# Patient Record
Sex: Male | Born: 1970 | Race: White | Hispanic: No | Marital: Married | Smoking: Former smoker
Health system: Southern US, Community
[De-identification: ages and names within clinical notes are randomized; demographics above are authoritative.]

## PROBLEM LIST (undated history)

## (undated) DIAGNOSIS — I10 Essential (primary) hypertension: Secondary | ICD-10-CM

## (undated) DIAGNOSIS — E785 Hyperlipidemia, unspecified: Secondary | ICD-10-CM

## (undated) DIAGNOSIS — K219 Gastro-esophageal reflux disease without esophagitis: Secondary | ICD-10-CM

## (undated) DIAGNOSIS — T7840XA Allergy, unspecified, initial encounter: Secondary | ICD-10-CM

## (undated) HISTORY — DX: Hyperlipidemia, unspecified: E78.5

## (undated) HISTORY — PX: VASECTOMY: SHX75

## (undated) HISTORY — DX: Allergy, unspecified, initial encounter: T78.40XA

## (undated) HISTORY — DX: Essential (primary) hypertension: I10

## (undated) HISTORY — DX: Gastro-esophageal reflux disease without esophagitis: K21.9

## (undated) HISTORY — PX: WISDOM TOOTH EXTRACTION: SHX21

---

## 2019-07-30 ENCOUNTER — Encounter: Payer: Self-pay | Admitting: Gastroenterology

## 2019-08-18 ENCOUNTER — Encounter: Payer: Self-pay | Admitting: Gastroenterology

## 2019-08-18 ENCOUNTER — Ambulatory Visit (AMBULATORY_SURGERY_CENTER): Payer: Self-pay

## 2019-08-18 ENCOUNTER — Other Ambulatory Visit: Payer: Self-pay

## 2019-08-18 VITALS — Temp 96.6°F | Ht 68.0 in | Wt 164.0 lb

## 2019-08-18 DIAGNOSIS — Z1211 Encounter for screening for malignant neoplasm of colon: Secondary | ICD-10-CM

## 2019-08-18 MED ORDER — NA SULFATE-K SULFATE-MG SULF 17.5-3.13-1.6 GM/177ML PO SOLN: 1.0000 | Freq: Once | ORAL | 0 refills | Status: AC

## 2019-08-18 NOTE — Progress Notes (Signed)
Denies allergies to eggs or soy products. Denies complication of anesthesia or sedation. Denies use of weight loss medication. Denies use of O2.   Emmi instructions given for colonoscopy.  Covid screening scheduled for Tuesday 09/01/19 @ 3:20 Pm at [facility]. A 15.00 coupon for Suprep was given to the patient.

## 2019-09-01 ENCOUNTER — Other Ambulatory Visit (HOSPITAL_COMMUNITY)
Admission: RE | Admit: 2019-09-01 | Discharge: 2019-09-01 | Disposition: A | Discharge status: Home / Self Care | Payer: BC Managed Care – PPO | Source: Ambulatory Visit | Attending: Gastroenterology | Admitting: Gastroenterology

## 2019-09-01 ENCOUNTER — Other Ambulatory Visit: Payer: Self-pay

## 2019-09-01 DIAGNOSIS — Z20828 Contact with and (suspected) exposure to other viral communicable diseases: Secondary | ICD-10-CM | POA: Diagnosis not present

## 2019-09-01 DIAGNOSIS — Z01812 Encounter for preprocedural laboratory examination: Secondary | ICD-10-CM | POA: Diagnosis not present

## 2019-09-01 LAB — SARS CORONAVIRUS 2 (TAT 6-24 HRS): SARS Coronavirus 2: NEGATIVE

## 2019-09-04 ENCOUNTER — Ambulatory Visit (AMBULATORY_SURGERY_CENTER): Payer: BC Managed Care – PPO | Admitting: Gastroenterology

## 2019-09-04 ENCOUNTER — Encounter: Payer: Self-pay | Admitting: Gastroenterology

## 2019-09-04 ENCOUNTER — Other Ambulatory Visit: Payer: Self-pay

## 2019-09-04 VITALS — BP 123/85 | HR 89 | Temp 98.9°F | Resp 20 | Ht 68.0 in | Wt 164.0 lb

## 2019-09-04 DIAGNOSIS — Z1211 Encounter for screening for malignant neoplasm of colon: Secondary | ICD-10-CM | POA: Diagnosis present

## 2019-09-04 DIAGNOSIS — K635 Polyp of colon: Secondary | ICD-10-CM | POA: Diagnosis not present

## 2019-09-04 DIAGNOSIS — D124 Benign neoplasm of descending colon: Secondary | ICD-10-CM

## 2019-09-04 MED ORDER — SODIUM CHLORIDE 0.9 % IV SOLN: 500.0000 mL | Freq: Once | INTRAVENOUS | Status: DC

## 2019-09-04 NOTE — Patient Instructions (Signed)
Handouts given:  Polyps, Diverticulosis Resume Previous diet Continue present medications Await Pathology results     YOU HAD AN ENDOSCOPIC PROCEDURE TODAY AT [place]:   Refer to the procedure report that was given to you for any specific questions about what was found during the examination.  If the procedure report does not answer your questions, please call your gastroenterologist to clarify.  If you requested that your care partner not be given the details of your procedure findings, then the procedure report has been included in a sealed envelope for you to review at your convenience later.  YOU SHOULD EXPECT: Some feelings of bloating in the abdomen. Passage of more gas than usual.  Walking can help get rid of the air that was put into your GI tract during the procedure and reduce the bloating. If you had a lower endoscopy (such as a colonoscopy or flexible sigmoidoscopy) you may notice spotting of blood in your stool or on the toilet paper. If you underwent a bowel prep for your procedure, you may not have a normal bowel movement for a few days.  Please Note:  You might notice some irritation and congestion in your nose or some drainage.  This is from the oxygen used during your procedure.  There is no need for concern and it should clear up in a day or so.  SYMPTOMS TO REPORT IMMEDIATELY:   Following lower endoscopy (colonoscopy or flexible sigmoidoscopy):  Excessive amounts of blood in the stool  Significant tenderness or worsening of abdominal pains  Swelling of the abdomen that is new, acute  Fever of 100F or higher    For urgent or emergent issues, a gastroenterologist can be reached at any hour by calling [phone].   DIET:  We do recommend a small meal at first, but then you may proceed to your regular diet.  Drink plenty of fluids but you should avoid alcoholic beverages for 24 hours.  ACTIVITY:  You should plan to take it easy for the rest of  today and you should NOT DRIVE or use heavy machinery until tomorrow (because of the sedation medicines used during the test).    FOLLOW UP: Our staff will call the number listed on your records 48-72 hours following your procedure to check on you and address any questions or concerns that you may have regarding the information given to you following your procedure. If we do not reach you, we will leave a message.  We will attempt to reach you two times.  During this call, we will ask if you have developed any symptoms of COVID 19. If you develop any symptoms (ie: fever, flu-like symptoms, shortness of breath, cough etc.) before then, please call [phone].  If you test positive for Covid 19 in the 2 weeks post procedure, please call and report this information to [place].    If any biopsies were taken you will be contacted by phone or by letter within the next 1-3 weeks.  Please call us at [phone] if you have not heard about the biopsies in 3 weeks.    SIGNATURES/CONFIDENTIALITY: You and/or your care partner have signed paperwork which will be entered into your electronic medical record.  These signatures attest to the fact that that the information above on your After Visit Summary has been reviewed and is understood.  Full responsibility of the confidentiality of this discharge information lies with you and/or your care-partner.

## 2019-09-04 NOTE — Progress Notes (Signed)
PT taken to PACU. Monitors in place. VSS. Report given to RN. 

## 2019-09-04 NOTE — Progress Notes (Signed)
[facility] Temp-JB  History reviewed. 

## 2019-09-04 NOTE — Progress Notes (Signed)
Called to room to assist during endoscopic procedure.  Patient ID and intended procedure confirmed with present staff. Received instructions for my participation in the procedure from the performing physician.  

## 2019-09-04 NOTE — Op Note (Signed)
[facility] Patient Name: Aaron Roberts Procedure Date: 09/04/2019 2:03 PM MRN: [id number] Endoscopist: Milus Banister , MD Age: 48 Referring MD:  Date of Birth: 17-Jul-1971 Gender: Male Account #: [id number] Procedure:                Colonoscopy Indications:              Screening for colorectal malignant neoplasm Medicines:                Monitored Anesthesia Care Procedure:                Pre-Anesthesia Assessment:                           - Prior to the procedure, a History and Physical                            was performed, and patient medications and                            allergies were reviewed. The patient's tolerance of                            previous anesthesia was also reviewed. The risks                            and benefits of the procedure and the sedation                            options and risks were discussed with the patient.                            All questions were answered, and informed consent                            was obtained. Prior Anticoagulants: The patient has                            taken no previous anticoagulant or antiplatelet                            agents. ASA Grade Assessment: II - A patient with                            mild systemic disease. After reviewing the risks                            and benefits, the patient was deemed in                            satisfactory condition to undergo the procedure.                           After obtaining informed consent, the colonoscope  was passed under direct vision. Throughout the                            procedure, the patient's blood pressure, pulse, and                            oxygen saturations were monitored continuously. The                            Colonoscope was introduced through the anus and                            advanced to the the cecum, identified by                            appendiceal orifice and  ileocecal valve. The                            colonoscopy was performed without difficulty. The                            patient tolerated the procedure well. The quality                            of the bowel preparation was good. The ileocecal                            valve, appendiceal orifice, and rectum were                            photographed. Scope In: 2:11:29 PM Scope Out: 2:26:39 PM Scope Withdrawal Time: 0 hours 12 minutes 22 seconds  Total Procedure Duration: 0 hours 15 minutes 10 seconds  Findings:                 Two sessile polyps were found in the descending                            colon. The polyps were 2 to 4 mm in size. These                            polyps were removed with a cold snare. Resection                            and retrieval were complete.                           Multiple small-mouthed diverticula were found in                            the left colon.                           The exam was otherwise without abnormality on  direct and retroflexion views. Complications:            No immediate complications. Estimated blood loss:                            None. Estimated Blood Loss:     Estimated blood loss: none. Impression:               - Two 2 to 4 mm polyps in the descending colon,                            removed with a cold snare. Resected and retrieved.                           - Diverticulosis in the left colon.                           - The examination was otherwise normal on direct                            and retroflexion views. Recommendation:           - Patient has a contact number available for                            emergencies. The signs and symptoms of potential                            delayed complications were discussed with the                            patient. Return to normal activities tomorrow.                            Written discharge instructions were provided to  the                            patient.                           - Resume previous diet.                           - Continue present medications.                           - Await pathology results. Milus Banister, MD 09/04/2019 2:28:59 PM This report has been signed electronically.

## 2019-09-08 ENCOUNTER — Telehealth: Payer: Self-pay

## 2019-09-08 NOTE — Telephone Encounter (Signed)
  Follow up Call-  Call back number 09/04/2019  Post procedure Call Back phone  # [phone]  Permission to leave phone message Yes     Patient questions:  Do you have a fever, pain , or abdominal swelling? No. Pain Score  0 *  Have you tolerated food without any problems? Yes.    Have you been able to return to your normal activities? Yes.    Do you have any questions about your discharge instructions: Diet   No. Medications  No. Follow up visit  No.  Do you have questions or concerns about your Care? No.  Actions: * If pain score is 4 or above: No action needed, pain <4.  1. Have you developed a fever since your procedure? no  2.   Have you had an respiratory symptoms (SOB or cough) since your procedure? no  3.   Have you tested positive for COVID 19 since your procedure no  4.   Have you had any family members/close contacts diagnosed with the COVID 19 since your procedure?  no   If yes to any of these questions please route to Joylene John, RN and Alphonsa Gin, Therapist, sports.

## 2019-09-10 ENCOUNTER — Encounter: Payer: Self-pay | Admitting: Gastroenterology

## 2021-05-30 ENCOUNTER — Other Ambulatory Visit: Payer: Self-pay | Admitting: Urology

## 2021-05-30 DIAGNOSIS — C61 Malignant neoplasm of prostate: Secondary | ICD-10-CM

## 2021-06-15 ENCOUNTER — Other Ambulatory Visit: Payer: Self-pay | Admitting: Urology

## 2021-06-19 ENCOUNTER — Other Ambulatory Visit: Payer: Self-pay

## 2021-06-19 ENCOUNTER — Ambulatory Visit
Admission: RE | Admit: 2021-06-19 | Discharge: 2021-06-19 | Disposition: A | Discharge status: Home / Self Care | Payer: BC Managed Care – PPO | Source: Ambulatory Visit | Attending: Urology | Admitting: Urology

## 2021-06-19 DIAGNOSIS — C61 Malignant neoplasm of prostate: Secondary | ICD-10-CM

## 2021-06-19 MED ORDER — GADOBENATE DIMEGLUMINE 529 MG/ML IV SOLN
15.0000 mL | Freq: Once | INTRAVENOUS | Status: AC | PRN
  Administered 2021-06-19: 15 mL via INTRAVENOUS

## 2021-07-20 NOTE — Progress Notes (Signed)
DUE TO COVID-19 ONLY ONE VISITOR IS ALLOWED TO COME WITH YOU AND STAY IN THE WAITING ROOM ONLY DURING PRE OP AND PROCEDURE DAY OF SURGERY. THE 1 VISITOR  MAY VISIT WITH YOU AFTER SURGERY IN YOUR PRIVATE ROOM DURING VISITING HOURS ONLY!  YOU NEED TO HAVE A COVID 19 TEST ON_11/11/2020 ______ @_______ , THIS TEST MUST BE DONE BEFORE SURGERY,  COVID TESTING SITE IS AT [place]. PLEASE REMAIN IN YOUR CAR THIS IS A DRIVER UP TEST. AFTER YOUR COVID TEST PLEASE WEAR A MASK OUT IN PUBLIC AND SOCIAL DISTANCE AND [place] YOUR HANDS FREQUENTLY. PLEASE ASK ALL YOUR CLOSE CONTACTS TO WEAR A MASK OUT IN PUBLIC AND SOCIAL DISTANCE AND [place] HANDS FREQUENTLY ALSO.               Aaron Roberts  07/20/2021   Your procedure is scheduled on:  07/31/2021   Report to [facility] Main  Entrance   Report to admitting at   [place] AM     Call this number if you have problems the morning of surgery [phone]    Remember: Do not eat food , candy gum or mints :After Midnight. You may have clear liquids from midnight until __ 0415am   Clear liquid diet the day before surgery.  17 grams of Miralax in 4 ounces of water at 12 noon day before surgery. Fleets enema nite before surgery.   CLEAR LIQUID DIET   Foods Allowed                                                                       Coffee and tea, regular and decaf                              Plain Jell-O any favor except red or purple                                            Fruit ices (not with fruit pulp)                                      Iced Popsicles                                     Carbonated beverages, regular and diet                                    Cranberry, grape and apple juices Sports drinks like Gatorade Lightly seasoned clear broth or consume(fat free) Sugar   _____________________________________________________________________    BRUSH YOUR TEETH MORNING OF SURGERY AND RINSE YOUR MOUTH OUT, NO CHEWING  GUM CANDY OR MINTS.     Take these medicines the morning of surgery with A SIP OF WATER:  pepcid   DO NOT TAKE ANY DIABETIC MEDICATIONS DAY OF YOUR  SURGERY                               You may not have any metal on your body including hair pins and              piercings  Do not wear jewelry, make-up, lotions, powders or perfumes, deodorant             Do not wear nail polish on your fingernails.  Do not shave  48 hours prior to surgery.              Men may shave face and neck.   Do not bring valuables to the hospital. [place].  Contacts, dentures or bridgework may not be worn into surgery.  Leave suitcase in the car. After surgery it may be brought to your room.     Patients discharged the day of surgery will not be allowed to drive home. IF YOU ARE HAVING SURGERY AND GOING HOME THE SAME DAY, YOU MUST HAVE AN ADULT TO DRIVE YOU HOME AND BE WITH YOU FOR 24 HOURS. YOU MAY GO HOME BY TAXI OR UBER OR ORTHERWISE, BUT AN ADULT MUST ACCOMPANY YOU HOME AND STAY WITH YOU FOR 24 HOURS.  Name and phone number of your driver:  Special Instructions: N/A              Please read over the following fact sheets you were given: _____________________________________________________________________  [facility] - Preparing for Surgery Before surgery, you can play an important role.  Because skin is not sterile, your skin needs to be as free of germs as possible.  You can reduce the number of germs on your skin by washing with CHG (chlorahexidine gluconate) soap before surgery.  CHG is an antiseptic cleaner which kills germs and bonds with the skin to continue killing germs even after washing. Please DO NOT use if you have an allergy to CHG or antibacterial soaps.  If your skin becomes reddened/irritated stop using the CHG and inform your nurse when you arrive at Short Stay. Do not shave (including legs and underarms) for at least 48 hours prior to the  first CHG shower.  You may shave your face/neck. Please follow these instructions carefully:  1.  Shower with CHG Soap the night before surgery and the  morning of Surgery.  2.  If you choose to wash your hair, wash your hair first as usual with your  normal  shampoo.  3.  After you shampoo, rinse your hair and body thoroughly to remove the  shampoo.                           4.  Use CHG as you would any other liquid soap.  You can apply chg directly  to the skin and wash                       Gently with a scrungie or clean washcloth.  5.  Apply the CHG Soap to your body ONLY FROM THE NECK DOWN.   Do not use on face/ open                           Wound or  open sores. Avoid contact with eyes, ears mouth and genitals (private parts).                       Wash face,  Genitals (private parts) with your normal soap.             6.  Wash thoroughly, paying special attention to the area where your surgery  will be performed.  7.  Thoroughly rinse your body with warm water from the neck down.  8.  DO NOT shower/wash with your normal soap after using and rinsing off  the CHG Soap.                9.  Pat yourself dry with a clean towel.            10.  Wear clean pajamas.            11.  Place clean sheets on your bed the night of your first shower and do not  sleep with pets. Day of Surgery : Do not apply any lotions/deodorants the morning of surgery.  Please wear clean clothes to the hospital/surgery center.  FAILURE TO FOLLOW THESE INSTRUCTIONS MAY RESULT IN THE CANCELLATION OF YOUR SURGERY PATIENT SIGNATURE_________________________________  NURSE SIGNATURE__________________________________  ________________________________________________________________________

## 2021-07-20 NOTE — Progress Notes (Addendum)
Anesthesia Review:  PCP: Arletha Grippe- with [facility] in [place]  Cardiologist : none  Chest x-ray : EKG : 07/24/21  Echo : Stress test: Cardiac Cath :  Activity level: can do a flight of stairs without difficulty  Sleep Study/ CPAP : none  Fasting Blood Sugar :      / Checks Blood Sugar -- times a day:   Blood Thinner/ Instructions /Last Dose: ASA / Instructions/ Last Dose :   Covid test- to be done at [facility] Stay on 07/27/2021 at 1000am - due to pt lives in [place], [place].  Called and spoke with Chrys Racer in short stay.   Blood pressure at preop was 155/104 and 150/100.  PT denies any chest pain, shortness of breath, headache or dizziness.  PT states that at last few MD visits blood pressure has been elevated.  Pt to call MD in regards to this prior to surgery.

## 2021-07-24 ENCOUNTER — Other Ambulatory Visit: Payer: Self-pay

## 2021-07-24 ENCOUNTER — Encounter (HOSPITAL_COMMUNITY)
Admission: RE | Admit: 2021-07-24 | Discharge: 2021-07-24 | Disposition: A | Discharge status: Home / Self Care | Payer: BC Managed Care – PPO | Source: Ambulatory Visit | Attending: Urology | Admitting: Urology

## 2021-07-24 ENCOUNTER — Encounter (INDEPENDENT_AMBULATORY_CARE_PROVIDER_SITE_OTHER): Payer: Self-pay

## 2021-07-24 ENCOUNTER — Encounter (HOSPITAL_COMMUNITY): Payer: Self-pay

## 2021-07-24 DIAGNOSIS — Z01818 Encounter for other preprocedural examination: Secondary | ICD-10-CM | POA: Insufficient documentation

## 2021-07-24 LAB — CBC
HCT: 48.2 % (ref 39.0–52.0)
Hemoglobin: 15.8 g/dL (ref 13.0–17.0)
MCH: 30.1 pg (ref 26.0–34.0)
MCHC: 32.8 g/dL (ref 30.0–36.0)
MCV: 91.8 fL (ref 80.0–100.0)
Platelets: 301 10*3/uL (ref 150–400)
RBC: 5.25 MIL/uL (ref 4.22–5.81)
RDW: 12.8 % (ref 11.5–15.5)
WBC: 5.6 10*3/uL (ref 4.0–10.5)
nRBC: 0 % (ref 0.0–0.2)

## 2021-07-24 LAB — BASIC METABOLIC PANEL
Anion gap: 6 (ref 5–15)
BUN: 11 mg/dL (ref 6–20)
CO2: 27 mmol/L (ref 22–32)
Calcium: 9.4 mg/dL (ref 8.9–10.3)
Chloride: 105 mmol/L (ref 98–111)
Creatinine, Ser: 1.01 mg/dL (ref 0.61–1.24)
GFR, Estimated: >60 mL/min (ref >60)
Glucose, Bld: 106 mg/dL — ABNORMAL HIGH (ref 70–99)
Potassium: 4.7 mmol/L (ref 3.5–5.1)
Sodium: 138 mmol/L (ref 135–145)

## 2021-07-27 ENCOUNTER — Other Ambulatory Visit (HOSPITAL_COMMUNITY)
Admission: RE | Admit: 2021-07-27 | Discharge: 2021-07-27 | Disposition: A | Discharge status: Home / Self Care | Payer: BC Managed Care – PPO | Source: Ambulatory Visit | Attending: Urology | Admitting: Urology

## 2021-07-27 DIAGNOSIS — Z01812 Encounter for preprocedural laboratory examination: Secondary | ICD-10-CM | POA: Diagnosis present

## 2021-07-27 DIAGNOSIS — Z20822 Contact with and (suspected) exposure to covid-19: Secondary | ICD-10-CM | POA: Insufficient documentation

## 2021-07-27 DIAGNOSIS — Z01818 Encounter for other preprocedural examination: Secondary | ICD-10-CM

## 2021-07-28 LAB — SARS CORONAVIRUS 2 (TAT 6-24 HRS): SARS Coronavirus 2: NEGATIVE

## 2021-07-28 NOTE — H&P (Signed)
Office Visit Report     07/18/2021   --------------------------------------------------------------------------------   Aaron Roberts  MRN: [id number]  DOB: Jul 03, 1971, 50 year old Male  SSN:    PRIMARY CARE:    REFERRING:  Aaron Miles, MD  PROVIDER:  Raynelle Bring, M.D.  TREATING:  Aaron Roberts [place], [place]  LOCATION:  [facility] [phone]     --------------------------------------------------------------------------------   CC/HPI: Pt presents today for pre-operative history and physical exam in anticipation of robotic assisted lap radical prostatectomy with bilateral pelvic lymph node dissection by Dr. Alinda Money on 07/31/21. He is doing well and is without complaint.   Pt denies F/C, HA, CP, SOB, N/V, diarrhea/constipation, back pain, flank pain, hematuria, and dysuria.     HX:   CC: Prostate Cancer   Physician requesting consult: Dr. Will Bonnet  PCP:   Aaron Roberts is a 50 year old gentleman who was noted to have an elevated PSA of 6.31 and a left prostate nodule. This prompted a TRUS biopsy of the prostate by Dr. Will Bonnet on 03/30/21 that confirmed Gleason 3+4=7 adenocarcinoma of the prostate with 6 out of 12 biopsy cores positive for malignancy.   Family history: No prostate cancer.   Imaging studies: None.   PMH: He has a history of GERD, hypertension, and hyperlipidemia.  PSH: No abdominal surgeries.   TNM stage: cT2b Nx Mx (Bilateral apical induration, Left > right)  PSA: 6.31  Gleason score: 3+4=7 (GG 2)  Biopsy (03/30/21 - ready by Dr. Nelva Nay, Acc# [id number]): 6/12 cores positive  Left: L apex (2/2 cores, 20%, 3+4=7), L mid (1/2 cores, 10%, 3+3=6)  Right: R mid (1/2 cores, 10%, 3+3=6), R base (2/2 cores, 40%, 3+4=7)  Prostate volume: 14.7 cc   Nomogram  OC disease: 58%  EPE: 40%  SVI: 4%  LNI: 6%  PFS (5 year, 10 year): 76%, 62%   Urinary function: IPSS is 1.  Erectile function: SHIM score is 22.     ALLERGIES: None    MEDICATIONS: Lisinopril 10 mg tablet  Famotidine 20 mg tablet  Rosuvastatin Calcium 5 mg tablet     GU PSH: None   NON-GU PSH: None   GU PMH: Prostate Cancer - 06/19/2021, - 05/30/2021    NON-GU PMH: Muscle weakness (generalized) - 06/19/2021 Other muscle spasm - 06/19/2021 Other specified disorders of muscle - 06/19/2021 Anxiety GERD Hypercholesterolemia Hypertension    FAMILY HISTORY: Breast Cancer - Sister Kidney Cancer - Father Lung Cancer - Brother narcolepsy - Sister   SOCIAL HISTORY: Marital Status: Married Preferred Language: English; Ethnicity: Not Hispanic Or Latino; Race: White Current Smoking Status: Patient does not smoke anymore. Has not smoked since 05/25/2000.   Tobacco Use Assessment Completed: Used Tobacco in last 30 days? Uses smokeless tobacco. Does drink.  Does not use drugs. Drinks 2 caffeinated drinks per day. Has not had a blood transfusion.     Notes: currently dips two or three times per day  ETOH beer 2-3 per day    REVIEW OF SYSTEMS:    GU Review Male:   Patient denies frequent urination, hard to postpone urination, burning/ pain with urination, get up at night to urinate, leakage of urine, stream starts and stops, trouble starting your stream, have to strain to urinate , erection problems, and penile pain.  Gastrointestinal (Upper):   Patient denies nausea, vomiting, and indigestion/ heartburn.  Gastrointestinal (Lower):   Patient denies diarrhea and constipation.  Constitutional:   Patient denies fever, night sweats, weight  loss, and fatigue.  Skin:   Patient denies skin rash/ lesion and itching.  Eyes:   Patient denies blurred vision and double vision.  Ears/ Nose/ Throat:   Patient denies sore throat and sinus problems.  Hematologic/Lymphatic:   Patient denies swollen glands and easy bruising.  Cardiovascular:   Patient denies leg swelling and chest pains.  Respiratory:   Patient denies cough and shortness of breath.  Endocrine:   Patient  denies excessive thirst.  Musculoskeletal:   Patient denies back pain and joint pain.  Neurological:   Patient denies headaches and dizziness.  Psychologic:   Patient denies depression and anxiety.   VITAL SIGNS:      07/18/2021 02:16 PM  Weight 165 lb / 74.84 kg  Height 68 in / 172.72 cm  BP 152/102 mmHg  Pulse 99 /min  Temperature 98.2 F / 36.7 C  BMI 25.1 kg/m   MULTI-SYSTEM PHYSICAL EXAMINATION:    Constitutional: Well-nourished. No physical deformities. Normally developed. Good grooming.  Neck: Neck symmetrical, not swollen. Normal tracheal position.  Respiratory: Normal breath sounds. No labored breathing, no use of accessory muscles.   Cardiovascular: Regular rate and rhythm. No murmur, no gallop.  Lymphatic: No enlargement of neck, axillae, groin.  Skin: No paleness, no jaundice, no cyanosis. No lesion, no ulcer, no rash.  Neurologic / Psychiatric: Oriented to time, oriented to place, oriented to person. No depression, no anxiety, no agitation.  Gastrointestinal: No mass, no tenderness, no rigidity, non obese abdomen.  Eyes: Normal conjunctivae. Normal eyelids.  Ears, Nose, Mouth, and Throat: Left ear no scars, no lesions, no masses. Right ear no scars, no lesions, no masses. Nose no scars, no lesions, no masses. Normal hearing. Normal lips.  Musculoskeletal: Normal gait and station of head and neck.     Complexity of Data:  Records Review:   Previous Patient Records  Urine Test Review:   Urinalysis   07/18/21  Urinalysis  Urine Appearance Clear   Urine Color Yellow   Urine Glucose Neg mg/dL  Urine Bilirubin Neg mg/dL  Urine Ketones Neg mg/dL  Urine Specific Gravity 1.015   Urine Blood Neg ery/uL  Urine pH 6.5   Urine Protein Neg mg/dL  Urine Urobilinogen 0.2 mg/dL  Urine Nitrites Neg   Urine Leukocyte Esterase Neg leu/uL   PROCEDURES:          Urinalysis - 81003 Dipstick Dipstick Cont'd  Color: Yellow Bilirubin: Neg mg/dL  Appearance: Clear Ketones: Neg  mg/dL  Specific Gravity: 1.015 Blood: Neg ery/uL  pH: 6.5 Protein: Neg mg/dL  Glucose: Neg mg/dL Urobilinogen: 0.2 mg/dL    Nitrites: Neg    Leukocyte Esterase: Neg leu/uL    ASSESSMENT:      ICD-10 Details  1 GU:   Prostate Cancer - C61    PLAN:           Schedule Return Visit/Planned Activity: Keep Scheduled Appointment - Schedule Surgery          Document Letter(s):  Created for Patient: Clinical Summary         Notes:   There are no changes in the patients history or physical exam since last evaluation by Dr. Alinda Money. Pt is scheduled to undergo RALP with BPLND on 07/31/21.   His BP is elevated today. He states he has not taken his meds yet today and will keep a close check on it at home.   All pt's questions were answered to the best of my ability.  Next Appointment:      Next Appointment: 07/31/2021 07:15 AM    Appointment Type: Surgery     Location: [facility] [phone]    Provider: Raynelle Bring, M.D.    Reason for Visit: WL/EXT REC RA LAP RAD PROSTATECTOMY LEVEL 2, BPLA WITH AMANDA      * Signed by Mcarthur Rossetti, PA on 07/18/21 at 3:42 PM (EDT*

## 2021-07-29 NOTE — Anesthesia Preprocedure Evaluation (Addendum)
Anesthesia Evaluation  Patient identified by MRN, date of birth, ID band Patient awake    Reviewed: Allergy & Precautions, H&P , NPO status , Patient's Chart, lab work & pertinent test results  Airway Mallampati: II  TM Distance: >3 FB Neck ROM: Full    Dental no notable dental hx. (+) Teeth Intact, Dental Advisory Given   Pulmonary neg pulmonary ROS, former smoker,    Pulmonary exam normal breath sounds clear to auscultation       Cardiovascular Exercise Tolerance: Good hypertension, Pt. on medications  Rhythm:Regular Rate:Normal     Neuro/Psych negative neurological ROS  negative psych ROS   GI/Hepatic Neg liver ROS, GERD  Medicated,  Endo/Other  negative endocrine ROS  Renal/GU negative Renal ROS  negative genitourinary   Musculoskeletal   Abdominal   Peds  Hematology negative hematology ROS (+)   Anesthesia Other Findings   Reproductive/Obstetrics negative OB ROS                            Anesthesia Physical Anesthesia Plan  ASA: 2  Anesthesia Plan: General   Post-op Pain Management:    Induction: Intravenous  PONV Risk Score and Plan: 3 and Ondansetron, Dexamethasone and Midazolam  Airway Management Planned: Oral ETT  Additional Equipment:   Intra-op Plan:   Post-operative Plan: Extubation in OR  Informed Consent: I have reviewed the patients History and Physical, chart, labs and discussed the procedure including the risks, benefits and alternatives for the proposed anesthesia with the patient or authorized representative who has indicated his/her understanding and acceptance.     Dental advisory given  Plan Discussed with: CRNA  Anesthesia Plan Comments:        Anesthesia Quick Evaluation

## 2021-07-31 ENCOUNTER — Encounter (HOSPITAL_COMMUNITY): Payer: Self-pay | Admitting: Urology

## 2021-07-31 ENCOUNTER — Ambulatory Visit (HOSPITAL_COMMUNITY): Payer: BC Managed Care – PPO | Admitting: Certified Registered Nurse Anesthetist

## 2021-07-31 ENCOUNTER — Observation Stay (HOSPITAL_COMMUNITY)
Admission: RE | Admit: 2021-07-31 | Discharge: 2021-08-01 | Disposition: A | Discharge status: Home / Self Care | Payer: BC Managed Care – PPO | Source: Ambulatory Visit | Attending: Urology | Admitting: Urology

## 2021-07-31 ENCOUNTER — Encounter (HOSPITAL_COMMUNITY)
Admission: RE | Disposition: A | Discharge status: Home / Self Care | Payer: Self-pay | Source: Ambulatory Visit | Attending: Urology

## 2021-07-31 ENCOUNTER — Ambulatory Visit (HOSPITAL_COMMUNITY): Payer: BC Managed Care – PPO | Admitting: Physician Assistant

## 2021-07-31 DIAGNOSIS — C61 Malignant neoplasm of prostate: Principal | ICD-10-CM | POA: Diagnosis present

## 2021-07-31 DIAGNOSIS — Z79899 Other long term (current) drug therapy: Secondary | ICD-10-CM | POA: Diagnosis not present

## 2021-07-31 DIAGNOSIS — I10 Essential (primary) hypertension: Secondary | ICD-10-CM | POA: Diagnosis not present

## 2021-07-31 HISTORY — PX: ROBOT ASSISTED LAPAROSCOPIC RADICAL PROSTATECTOMY: SHX5141

## 2021-07-31 LAB — HEMOGLOBIN AND HEMATOCRIT, BLOOD
HCT: 44.3 % (ref 39.0–52.0)
Hemoglobin: 14.5 g/dL (ref 13.0–17.0)

## 2021-07-31 LAB — TYPE AND SCREEN
ABO/RH(D): O POS
Antibody Screen: NEGATIVE

## 2021-07-31 LAB — ABO/RH: ABO/RH(D): O POS

## 2021-07-31 SURGERY — XI ROBOTIC ASSISTED LAPAROSCOPIC RADICAL PROSTATECTOMY LEVEL 2
Anesthesia: General

## 2021-07-31 MED ORDER — ONDANSETRON HCL 4 MG/2ML IJ SOLN
INTRAMUSCULAR | Status: AC
  Filled 2021-07-31: qty 2

## 2021-07-31 MED ORDER — KETAMINE HCL 10 MG/ML IJ SOLN
INTRAMUSCULAR | Status: DC | PRN
  Administered 2021-07-31: 30 mg via INTRAVENOUS

## 2021-07-31 MED ORDER — ROCURONIUM BROMIDE 10 MG/ML (PF) SYRINGE
PREFILLED_SYRINGE | INTRAVENOUS | Status: AC
  Filled 2021-07-31: qty 10

## 2021-07-31 MED ORDER — BELLADONNA ALKALOIDS-OPIUM 16.2-60 MG RE SUPP
1.0000 | Freq: Four times a day (QID) | RECTAL | Status: DC | PRN
  Filled 2021-07-31: qty 1

## 2021-07-31 MED ORDER — ROSUVASTATIN CALCIUM 5 MG PO TABS
5.0000 mg | ORAL_TABLET | Freq: Every day | ORAL | Status: DC
  Administered 2021-07-31 – 2021-08-01 (×2): 5 mg via ORAL
  Filled 2021-07-31 (×2): qty 1

## 2021-07-31 MED ORDER — ACETAMINOPHEN 500 MG PO TABS
1000.0000 mg | ORAL_TABLET | Freq: Once | ORAL | Status: AC
  Administered 2021-07-31: 1000 mg via ORAL
  Filled 2021-07-31: qty 2

## 2021-07-31 MED ORDER — BACITRACIN-NEOMYCIN-POLYMYXIN 400-5-5000 EX OINT: 1.0000 "application " | TOPICAL_OINTMENT | Freq: Three times a day (TID) | CUTANEOUS | Status: DC | PRN

## 2021-07-31 MED ORDER — STERILE WATER FOR IRRIGATION IR SOLN
Status: DC | PRN
  Administered 2021-07-31: 1000 mL

## 2021-07-31 MED ORDER — MIDAZOLAM HCL 2 MG/2ML IJ SOLN
INTRAMUSCULAR | Status: AC
  Filled 2021-07-31: qty 2

## 2021-07-31 MED ORDER — FLEET ENEMA 7-19 GM/118ML RE ENEM: 1.0000 | ENEMA | Freq: Once | RECTAL | Status: DC

## 2021-07-31 MED ORDER — BUPIVACAINE-EPINEPHRINE (PF) 0.25% -1:200000 IJ SOLN
INTRAMUSCULAR | Status: AC
  Filled 2021-07-31: qty 30

## 2021-07-31 MED ORDER — KETOROLAC TROMETHAMINE 15 MG/ML IJ SOLN
15.0000 mg | Freq: Four times a day (QID) | INTRAMUSCULAR | Status: DC
  Administered 2021-07-31 – 2021-08-01 (×3): 15 mg via INTRAVENOUS
  Filled 2021-07-31 (×3): qty 1

## 2021-07-31 MED ORDER — KCL IN DEXTROSE-NACL 20-5-0.45 MEQ/L-%-% IV SOLN
INTRAVENOUS | Status: AC
  Administered 2021-07-31: 1000 mL via INTRAVENOUS
  Filled 2021-07-31: qty 1000

## 2021-07-31 MED ORDER — HYDROMORPHONE HCL 1 MG/ML IJ SOLN
INTRAMUSCULAR | Status: DC | PRN
  Administered 2021-07-31: 1 mg via INTRAVENOUS

## 2021-07-31 MED ORDER — PHENYLEPHRINE 40 MCG/ML (10ML) SYRINGE FOR IV PUSH (FOR BLOOD PRESSURE SUPPORT)
PREFILLED_SYRINGE | INTRAVENOUS | Status: AC
  Filled 2021-07-31: qty 10

## 2021-07-31 MED ORDER — PROPOFOL 500 MG/50ML IV EMUL
INTRAVENOUS | Status: DC | PRN
  Administered 2021-07-31: 25 ug/kg/min via INTRAVENOUS

## 2021-07-31 MED ORDER — DIPHENHYDRAMINE HCL 12.5 MG/5ML PO ELIX: 12.5000 mg | ORAL_SOLUTION | Freq: Four times a day (QID) | ORAL | Status: DC | PRN

## 2021-07-31 MED ORDER — POLYETHYLENE GLYCOL 3350 17 G PO PACK: 17.0000 g | PACK | Freq: Every day | ORAL | Status: DC

## 2021-07-31 MED ORDER — SULFAMETHOXAZOLE-TRIMETHOPRIM 800-160 MG PO TABS: 1.0000 | ORAL_TABLET | Freq: Two times a day (BID) | ORAL | 0 refills | Status: DC

## 2021-07-31 MED ORDER — DOCUSATE SODIUM 100 MG PO CAPS: 100.0000 mg | ORAL_CAPSULE | Freq: Two times a day (BID) | ORAL | Status: AC

## 2021-07-31 MED ORDER — PROPOFOL 10 MG/ML IV BOLUS
INTRAVENOUS | Status: DC | PRN
  Administered 2021-07-31: 50 mg via INTRAVENOUS
  Administered 2021-07-31: 150 mg via INTRAVENOUS

## 2021-07-31 MED ORDER — CEFAZOLIN SODIUM-DEXTROSE 1-4 GM/50ML-% IV SOLN
1.0000 g | Freq: Three times a day (TID) | INTRAVENOUS | Status: AC
  Administered 2021-07-31 (×2): 1 g via INTRAVENOUS
  Filled 2021-07-31: qty 50

## 2021-07-31 MED ORDER — PROPOFOL 1000 MG/100ML IV EMUL
INTRAVENOUS | Status: AC
  Filled 2021-07-31: qty 100

## 2021-07-31 MED ORDER — ZOLPIDEM TARTRATE 5 MG PO TABS: 5.0000 mg | ORAL_TABLET | Freq: Every evening | ORAL | Status: DC | PRN

## 2021-07-31 MED ORDER — HEPARIN SODIUM (PORCINE) 1000 UNIT/ML IJ SOLN
INTRAMUSCULAR | Status: AC
  Filled 2021-07-31: qty 1

## 2021-07-31 MED ORDER — LIDOCAINE HCL (PF) 2 % IJ SOLN
INTRAMUSCULAR | Status: DC | PRN
  Administered 2021-07-31: 1.5 mg/kg/h via INTRADERMAL

## 2021-07-31 MED ORDER — KETAMINE HCL 10 MG/ML IJ SOLN
INTRAMUSCULAR | Status: AC
  Filled 2021-07-31: qty 1

## 2021-07-31 MED ORDER — PHENYLEPHRINE HCL (PRESSORS) 10 MG/ML IV SOLN
INTRAVENOUS | Status: AC
  Filled 2021-07-31: qty 2

## 2021-07-31 MED ORDER — SODIUM CHLORIDE 0.9 % IR SOLN
Status: DC | PRN
  Administered 2021-07-31: 1000 mL via INTRAVESICAL

## 2021-07-31 MED ORDER — MIDAZOLAM HCL 5 MG/5ML IJ SOLN
INTRAMUSCULAR | Status: DC | PRN
  Administered 2021-07-31: 2 mg via INTRAVENOUS

## 2021-07-31 MED ORDER — LISINOPRIL 10 MG PO TABS
10.0000 mg | ORAL_TABLET | Freq: Every day | ORAL | Status: DC
  Administered 2021-07-31 – 2021-08-01 (×2): 10 mg via ORAL
  Filled 2021-07-31 (×2): qty 1

## 2021-07-31 MED ORDER — HYDROMORPHONE HCL 1 MG/ML IJ SOLN: 0.2500 mg | INTRAMUSCULAR | Status: DC | PRN

## 2021-07-31 MED ORDER — ROCURONIUM BROMIDE 10 MG/ML (PF) SYRINGE
PREFILLED_SYRINGE | INTRAVENOUS | Status: DC | PRN
  Administered 2021-07-31: 30 mg via INTRAVENOUS
  Administered 2021-07-31: 70 mg via INTRAVENOUS
  Administered 2021-07-31: 20 mg via INTRAVENOUS

## 2021-07-31 MED ORDER — PHENYLEPHRINE 40 MCG/ML (10ML) SYRINGE FOR IV PUSH (FOR BLOOD PRESSURE SUPPORT)
PREFILLED_SYRINGE | INTRAVENOUS | Status: DC | PRN
  Administered 2021-07-31 (×2): 120 ug via INTRAVENOUS
  Administered 2021-07-31: 80 ug via INTRAVENOUS
  Administered 2021-07-31: 120 ug via INTRAVENOUS
  Administered 2021-07-31: 80 ug via INTRAVENOUS

## 2021-07-31 MED ORDER — HYDROMORPHONE HCL 2 MG/ML IJ SOLN
INTRAMUSCULAR | Status: AC
  Filled 2021-07-31: qty 1

## 2021-07-31 MED ORDER — LIDOCAINE HCL 2 % IJ SOLN
INTRAMUSCULAR | Status: AC
  Filled 2021-07-31: qty 20

## 2021-07-31 MED ORDER — KETOROLAC TROMETHAMINE 15 MG/ML IJ SOLN
INTRAMUSCULAR | Status: AC
  Administered 2021-07-31: 15 mg via INTRAVENOUS
  Filled 2021-07-31: qty 1

## 2021-07-31 MED ORDER — SUGAMMADEX SODIUM 200 MG/2ML IV SOLN
INTRAVENOUS | Status: DC | PRN
Start: 2021-07-31 — End: 2021-07-31
  Administered 2021-07-31: 200 mg via INTRAVENOUS

## 2021-07-31 MED ORDER — DEXAMETHASONE SODIUM PHOSPHATE 10 MG/ML IJ SOLN
INTRAMUSCULAR | Status: AC
  Filled 2021-07-31: qty 1

## 2021-07-31 MED ORDER — ORAL CARE MOUTH RINSE: 15.0000 mL | Freq: Once | OROMUCOSAL | Status: AC

## 2021-07-31 MED ORDER — BUPIVACAINE-EPINEPHRINE (PF) 0.25% -1:200000 IJ SOLN
INTRAMUSCULAR | Status: DC | PRN
  Administered 2021-07-31: 30 mL

## 2021-07-31 MED ORDER — TRAMADOL HCL 50 MG PO TABS: 50.0000 mg | ORAL_TABLET | Freq: Four times a day (QID) | ORAL | 0 refills | Status: DC | PRN

## 2021-07-31 MED ORDER — CEFAZOLIN SODIUM-DEXTROSE 2-4 GM/100ML-% IV SOLN
2.0000 g | Freq: Once | INTRAVENOUS | Status: AC
  Administered 2021-07-31: 2 g via INTRAVENOUS
  Filled 2021-07-31: qty 100

## 2021-07-31 MED ORDER — PHENYLEPHRINE HCL-NACL 20-0.9 MG/250ML-% IV SOLN
INTRAVENOUS | Status: DC | PRN
  Administered 2021-07-31: 50 ug/min via INTRAVENOUS

## 2021-07-31 MED ORDER — BUPIVACAINE-EPINEPHRINE (PF) 0.5% -1:200000 IJ SOLN
INTRAMUSCULAR | Status: AC
  Filled 2021-07-31: qty 30

## 2021-07-31 MED ORDER — SODIUM CHLORIDE 0.9 % IV BOLUS
1000.0000 mL | Freq: Once | INTRAVENOUS | Status: AC
  Administered 2021-07-31: 1000 mL via INTRAVENOUS

## 2021-07-31 MED ORDER — DIPHENHYDRAMINE HCL 50 MG/ML IJ SOLN: 12.5000 mg | Freq: Four times a day (QID) | INTRAMUSCULAR | Status: DC | PRN

## 2021-07-31 MED ORDER — KCL IN DEXTROSE-NACL 20-5-0.45 MEQ/L-%-% IV SOLN
INTRAVENOUS | Status: DC
  Filled 2021-07-31 (×2): qty 1000

## 2021-07-31 MED ORDER — LACTATED RINGERS IV SOLN
INTRAVENOUS | Status: DC | PRN
  Administered 2021-07-31: 1000 mL

## 2021-07-31 MED ORDER — ONDANSETRON HCL 4 MG/2ML IJ SOLN: 4.0000 mg | INTRAMUSCULAR | Status: DC | PRN

## 2021-07-31 MED ORDER — CEFAZOLIN SODIUM-DEXTROSE 1-4 GM/50ML-% IV SOLN
INTRAVENOUS | Status: AC
  Filled 2021-07-31: qty 50

## 2021-07-31 MED ORDER — FENTANYL CITRATE (PF) 100 MCG/2ML IJ SOLN
INTRAMUSCULAR | Status: AC
  Filled 2021-07-31: qty 2

## 2021-07-31 MED ORDER — LACTATED RINGERS IV SOLN: INTRAVENOUS | Status: DC

## 2021-07-31 MED ORDER — CHLORHEXIDINE GLUCONATE CLOTH 2 % EX PADS: 6.0000 | MEDICATED_PAD | Freq: Every day | CUTANEOUS | Status: DC

## 2021-07-31 MED ORDER — FENTANYL CITRATE (PF) 250 MCG/5ML IJ SOLN
INTRAMUSCULAR | Status: AC
  Filled 2021-07-31: qty 5

## 2021-07-31 MED ORDER — FENTANYL CITRATE (PF) 100 MCG/2ML IJ SOLN
INTRAMUSCULAR | Status: DC | PRN
  Administered 2021-07-31: 100 ug via INTRAVENOUS
  Administered 2021-07-31: 50 ug via INTRAVENOUS
  Administered 2021-07-31 (×2): 100 ug via INTRAVENOUS

## 2021-07-31 MED ORDER — DOCUSATE SODIUM 100 MG PO CAPS
100.0000 mg | ORAL_CAPSULE | Freq: Two times a day (BID) | ORAL | Status: DC
  Administered 2021-07-31 – 2021-08-01 (×3): 100 mg via ORAL
  Filled 2021-07-31 (×3): qty 1

## 2021-07-31 MED ORDER — FAMOTIDINE 20 MG PO TABS
20.0000 mg | ORAL_TABLET | Freq: Every day | ORAL | Status: DC
  Administered 2021-08-01: 20 mg via ORAL
  Filled 2021-07-31: qty 1

## 2021-07-31 MED ORDER — MORPHINE SULFATE (PF) 4 MG/ML IV SOLN
2.0000 mg | INTRAVENOUS | Status: DC | PRN
  Administered 2021-07-31 – 2021-08-01 (×4): 4 mg via INTRAVENOUS
  Filled 2021-07-31 (×4): qty 1

## 2021-07-31 MED ORDER — CHLORHEXIDINE GLUCONATE 0.12 % MT SOLN
15.0000 mL | Freq: Once | OROMUCOSAL | Status: AC
  Administered 2021-07-31: 15 mL via OROMUCOSAL

## 2021-07-31 MED ORDER — LIDOCAINE 2% (20 MG/ML) 5 ML SYRINGE
INTRAMUSCULAR | Status: DC | PRN
  Administered 2021-07-31: 50 mg via INTRAVENOUS

## 2021-07-31 MED ORDER — ONDANSETRON HCL 4 MG/2ML IJ SOLN
INTRAMUSCULAR | Status: DC | PRN
  Administered 2021-07-31: 4 mg via INTRAVENOUS

## 2021-07-31 MED ORDER — DEXAMETHASONE SODIUM PHOSPHATE 4 MG/ML IJ SOLN
INTRAMUSCULAR | Status: DC | PRN
  Administered 2021-07-31: 10 mg via INTRAVENOUS

## 2021-07-31 MED ORDER — ACETAMINOPHEN 325 MG PO TABS: 650.0000 mg | ORAL_TABLET | ORAL | Status: DC | PRN

## 2021-07-31 SURGICAL SUPPLY — 60 items
APPLICATOR COTTON TIP 6 STRL (MISCELLANEOUS) ×1 IMPLANT
APPLICATOR COTTON TIP 6IN STRL (MISCELLANEOUS) ×2
BAG COUNTER SPONGE SURGICOUNT (BAG) IMPLANT
CATH FOLEY 2WAY SLVR 18FR 30CC (CATHETERS) ×2 IMPLANT
CATH ROBINSON RED A/P 16FR (CATHETERS) ×2 IMPLANT
CATH ROBINSON RED A/P 8FR (CATHETERS) ×2 IMPLANT
CATH TIEMANN FOLEY 18FR 5CC (CATHETERS) ×2 IMPLANT
CHLORAPREP W/TINT 26 (MISCELLANEOUS) ×2 IMPLANT
CLIP LIGATING HEM O LOK PURPLE (MISCELLANEOUS) ×4 IMPLANT
COVER SURGICAL LIGHT HANDLE (MISCELLANEOUS) ×2 IMPLANT
COVER TIP SHEARS 8 DVNC (MISCELLANEOUS) ×1 IMPLANT
COVER TIP SHEARS 8MM DA VINCI (MISCELLANEOUS) ×1
CUTTER ECHEON FLEX ENDO 45 340 (ENDOMECHANICALS) ×2 IMPLANT
DECANTER SPIKE VIAL GLASS SM (MISCELLANEOUS) ×2 IMPLANT
DERMABOND ADVANCED (GAUZE/BANDAGES/DRESSINGS) ×1
DERMABOND ADVANCED .7 DNX12 (GAUZE/BANDAGES/DRESSINGS) ×1 IMPLANT
DRAIN CHANNEL RND F F (WOUND CARE) IMPLANT
DRAPE ARM DVNC X/XI (DISPOSABLE) ×4 IMPLANT
DRAPE COLUMN DVNC XI (DISPOSABLE) ×1 IMPLANT
DRAPE DA VINCI XI ARM (DISPOSABLE) ×4
DRAPE DA VINCI XI COLUMN (DISPOSABLE) ×1
DRAPE SURG IRRIG POUCH 19X23 (DRAPES) ×2 IMPLANT
DRSG TEGADERM 4X4.75 (GAUZE/BANDAGES/DRESSINGS) ×2 IMPLANT
ELECT PENCIL ROCKER SW 15FT (MISCELLANEOUS) ×2 IMPLANT
ELECT REM PT RETURN 15FT ADLT (MISCELLANEOUS) ×2 IMPLANT
GAUZE 4X4 16PLY ~~LOC~~+RFID DBL (SPONGE) ×2 IMPLANT
GAUZE SPONGE 4X4 12PLY STRL (GAUZE/BANDAGES/DRESSINGS) ×2 IMPLANT
GLOVE SURG ENC MOIS LTX SZ6.5 (GLOVE) ×2 IMPLANT
GLOVE SURG ENC TEXT LTX SZ7.5 (GLOVE) ×4 IMPLANT
GOWN STRL REUS W/TWL LRG LVL3 (GOWN DISPOSABLE) ×6 IMPLANT
HOLDER FOLEY CATH W/STRAP (MISCELLANEOUS) ×2 IMPLANT
IRRIG SUCT STRYKERFLOW 2 WTIP (MISCELLANEOUS) ×2
IRRIGATION SUCT STRKRFLW 2 WTP (MISCELLANEOUS) ×1 IMPLANT
IV LACTATED RINGERS 1000ML (IV SOLUTION) ×2 IMPLANT
KIT TURNOVER KIT A (KITS) IMPLANT
NDL SAFETY ECLIPSE 18X1.5 (NEEDLE) ×1 IMPLANT
NEEDLE HYPO 18GX1.5 SHARP (NEEDLE) ×1
PACK ROBOT UROLOGY CUSTOM (CUSTOM PROCEDURE TRAY) ×2 IMPLANT
SEAL CANN UNIV 5-8 DVNC XI (MISCELLANEOUS) ×4 IMPLANT
SEAL XI 5MM-8MM UNIVERSAL (MISCELLANEOUS) ×4
SET TUBE SMOKE EVAC HIGH FLOW (TUBING) ×2 IMPLANT
SOLUTION ELECTROLUBE (MISCELLANEOUS) ×2 IMPLANT
STAPLE RELOAD 45 GRN (STAPLE) ×1 IMPLANT
STAPLE RELOAD 45MM GREEN (STAPLE) ×1
SUT ETHILON 3 0 PS 1 (SUTURE) ×2 IMPLANT
SUT MNCRL 3 0 RB1 (SUTURE) ×1 IMPLANT
SUT MNCRL 3 0 VIOLET RB1 (SUTURE) ×1 IMPLANT
SUT MNCRL AB 4-0 PS2 18 (SUTURE) ×4 IMPLANT
SUT MONOCRYL 3 0 RB1 (SUTURE) ×2
SUT PDS PLUS 0 (SUTURE) ×2
SUT PDS PLUS AB 0 CT-2 (SUTURE) ×2 IMPLANT
SUT VIC AB 0 CT1 27 (SUTURE) ×1
SUT VIC AB 0 CT1 27XBRD ANTBC (SUTURE) ×1 IMPLANT
SUT VIC AB 0 UR5 27 (SUTURE) ×2 IMPLANT
SUT VIC AB 2-0 SH 27 (SUTURE) ×1
SUT VIC AB 2-0 SH 27X BRD (SUTURE) ×1 IMPLANT
SYR 27GX1/2 1ML LL SAFETY (SYRINGE) ×2 IMPLANT
TOWEL OR NON WOVEN STRL DISP B (DISPOSABLE) ×2 IMPLANT
TROCAR XCEL NON-BLD 5MMX100MML (ENDOMECHANICALS) ×2 IMPLANT
WATER STERILE IRR 1000ML POUR (IV SOLUTION) ×2 IMPLANT

## 2021-07-31 NOTE — Op Note (Signed)
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 M.D.  Assistant: Debbrah Alar, PA-C  An assistant was required for this surgical procedure.  The duties of the assistant included but were not limited to suctioning, passing suture, camera manipulation, retraction. This procedure would not be able to be performed without an Environmental consultant.  Anesthesia: General  Complications: None  EBL: 100 mL  IVF:  1500 mL crystalloid  Specimens: Prostate and seminal vesicles Right pelvic lymph nodes Left pelvic lymph nodes  Disposition of specimens: Pathology  Drains: 20 Fr coude catheter # 19 Blake pelvic drain  Indication: Aaron Roberts is a 50 y.o. year old patient with clinically localized prostate cancer.  After a thorough review of the management options for treatment of prostate cancer, he elected to proceed with surgical therapy and the above procedure(s).  We have discussed the potential benefits and risks of the procedure, side effects of the proposed treatment, the likelihood of the patient achieving the goals of the procedure, and any potential problems that might occur during the procedure or recuperation. Informed consent has been obtained.  Description of procedure:  The patient was taken to the operating room and a general anesthetic was administered. He was given preoperative antibiotics, placed in the dorsal lithotomy position, and prepped and draped in the usual sterile fashion. Next a preoperative timeout was performed. A urethral catheter was placed into the bladder and a site was selected near the umbilicus for placement of the camera  port. This was placed using a standard open Hassan technique which allowed entry into the peritoneal cavity under direct vision and without difficulty. An 8 mm robotic port was placed and a pneumoperitoneum established. The camera was then used to inspect the abdomen and there was no evidence of any intra-abdominal injuries or other abnormalities. The remaining abdominal ports were then placed. 8 mm robotic ports were placed in the right lower quadrant, left lower quadrant, and far left lateral abdominal wall. A 5 mm port was placed in the right upper quadrant and a 12 mm port was placed in the right lateral abdominal wall for laparoscopic assistance. All ports were placed under direct vision without difficulty. The surgical cart was then docked.   Utilizing the cautery scissors, the bladder was reflected posteriorly allowing entry into the space of Retzius and identification of the endopelvic fascia and prostate. The periprostatic fat was then removed from the prostate allowing full exposure of the endopelvic fascia. The endopelvic fascia was then incised from the apex back to the base of the prostate bilaterally and the underlying levator muscle fibers were swept laterally off the prostate thereby isolating the dorsal venous complex. The dorsal vein was then stapled and divided with a 45 mm Flex Echelon stapler. Attention then turned to the bladder neck which was divided anteriorly thereby allowing entry into the bladder and exposure of the urethral catheter. The catheter balloon was deflated and the catheter was brought into the operative field and used to retract the prostate anteriorly. The posterior bladder neck was then examined and was divided allowing further dissection between the bladder and prostate posteriorly until the vasa deferentia and seminal vessels were identified. The vasa deferentia were isolated, divided, and lifted anteriorly. The seminal vesicles were dissected down to their  tips with care  to control the seminal vascular arterial blood supply. These structures were then lifted anteriorly and the space between Denonvillier's fascia and the anterior rectum was developed with a combination of sharp and blunt dissection. This isolated the vascular pedicles of the prostate.  The lateral prostatic fascia was then sharply incised allowing release of the neurovascular bundles bilaterally. The vascular pedicles of the prostate were then ligated with Weck clips between the prostate and neurovascular bundles and divided with sharp cold scissor dissection resulting in neurovascular bundle preservation. The neurovascular bundles were then separated off the apex of the prostate and urethra bilaterally.  The urethra was then sharply transected allowing the prostate specimen to be disarticulated. The pelvis was copiously irrigated and hemostasis was ensured. There was no evidence for rectal injury.  Attention then turned to the right pelvic sidewall. The fibrofatty tissue between the external iliac vein, confluence of the iliac vessels, hypogastric artery, and Cooper's ligament was dissected free from the pelvic sidewall with care to preserve the obturator nerve. Weck clips were used for lymphostasis and hemostasis. An identical procedure was performed on the contralateral side and the lymphatic packets were removed for permanent pathologic analysis.  Attention then turned to the urethral anastomosis. A 2-0 Vicryl slip knot was placed between Denonvillier's fascia, the posterior bladder neck, and the posterior urethra to reapproximate these structures. A double-armed 3-0 Monocryl suture was then used to perform a 360 running tension-free anastomosis between the bladder neck and urethra. A new urethral catheter was then placed into the bladder and irrigated. There were no blood clots within the bladder and the anastomosis appeared to be watertight. A #19 Blake drain was then brought through the left lateral 8  mm port site and positioned appropriately within the pelvis. It was secured to the skin with a nylon suture. The surgical cart was then undocked. The right lateral 12 mm port site was closed at the fascial level with a 0 Vicryl suture placed laparoscopically. All remaining ports were then removed under direct vision. The prostate specimen was removed intact within the Endopouch retrieval bag via the periumbilical camera port site. This fascial opening was closed with two running 0 PDS sutures. 0.25% Marcaine was then injected into all port sites and all incisions were reapproximated at the skin level with 4-0 Monocryl subcuticular sutures and Dermabond. The patient appeared to tolerate the procedure well and without complications. The patient was able to be extubated and transferred to the recovery unit in satisfactory condition.   Pryor Curia MD

## 2021-07-31 NOTE — Progress Notes (Addendum)
Received patient from [facility] PACU into room 1415. Wife at bedside. Patient alert and oriented x 4. Vital signs taken. Assessment to follow.

## 2021-07-31 NOTE — Anesthesia Procedure Notes (Signed)
Procedure Name: Intubation Date/Time: 07/31/2021 7:27 AM Performed by: Claudia Desanctis, CRNA Pre-anesthesia Checklist: Patient identified, Emergency Drugs available, Suction available and Patient being monitored Patient Re-evaluated:Patient Re-evaluated prior to induction Oxygen Delivery Method: Circle system utilized Preoxygenation: Pre-oxygenation with 100% oxygen Induction Type: IV induction Ventilation: Mask ventilation without difficulty Laryngoscope Size: 2 and Miller Grade View: Grade I Tube type: Oral Tube size: 8.0 mm Number of attempts: 1 Airway Equipment and Method: Stylet Placement Confirmation: ETT inserted through vocal cords under direct vision, positive ETCO2 and breath sounds checked- equal and bilateral Secured at: 22 cm Tube secured with: Tape Dental Injury: Teeth and Oropharynx as per pre-operative assessment

## 2021-07-31 NOTE — Anesthesia Postprocedure Evaluation (Signed)
Anesthesia Post Note  Patient: Aaron Roberts  Procedure(s) Performed: XI ROBOTIC ASSISTED LAPAROSCOPIC RADICAL PROSTATECTOMY LEVEL 2     Patient location during evaluation: PACU Anesthesia Type: General Level of consciousness: awake and alert Pain management: pain level controlled Vital Signs Assessment: post-procedure vital signs reviewed and stable Respiratory status: spontaneous breathing, nonlabored ventilation, respiratory function stable and patient connected to nasal cannula oxygen Cardiovascular status: blood pressure returned to baseline and stable Postop Assessment: no apparent nausea or vomiting Anesthetic complications: no   No notable events documented.  Last Vitals:  Vitals:   07/31/21 1200 07/31/21 1300  BP: (!) 133/95 (!) 130/98  Pulse: 97 99  Resp:    Temp:    SpO2: (!) 87% 95%    Last Pain:  Vitals:   07/31/21 1300  TempSrc:   PainSc: 0-No pain                 Jalene Demo,W. EDMOND

## 2021-07-31 NOTE — Progress Notes (Signed)
Patient ID: Aaron Roberts, male   DOB: [birth year], 50 y.o.   MRN: [id number]  Post-op note  Subjective: The patient is doing well.  No complaints.  Objective: Vital signs in last 24 hours: Temp:  [97.6 F (36.4 C)-98.4 F (36.9 C)] 97.6 F (36.4 C) (11/07 1023) Pulse Rate:  [84-99] 99 (11/07 1300) Resp:  [7-17] 17 (11/07 1102) BP: (129-163)/(89-104) 130/98 (11/07 1300) SpO2:  [87 %-100 %] 95 % (11/07 1300) Weight:  [74.4 kg] 74.4 kg (11/07 0517)  Intake/Output from previous day: No intake/output data recorded. Intake/Output this shift: Total I/O In: 2600 [I.V.:1500; IV Piggyback:1100] Out: 100 [Blood:100]  Physical Exam:  General: Alert and oriented. Abdomen: Soft, Nondistended. Incisions: Clean and dry.  Lab Results: Recent Labs    07/31/21 1047  HGB 14.5  HCT 44.3    Assessment/Plan: POD#0   1) Continue to monitor, ambulate, IS   Pryor Curia. MD   LOS: 0 days   Aaron Roberts 07/31/2021, 2:23 PM

## 2021-07-31 NOTE — Transfer of Care (Signed)
Immediate Anesthesia Transfer of Care Note  Patient: Aaron Roberts  Procedure(s) Performed: XI ROBOTIC ASSISTED LAPAROSCOPIC RADICAL PROSTATECTOMY LEVEL 2  Patient Location: PACU  Anesthesia Type:General  Level of Consciousness: awake, alert , oriented and patient cooperative  Airway & Oxygen Therapy: Patient Spontanous Breathing and Patient connected to face mask  Post-op Assessment: Report given to RN and Post -op Vital signs reviewed and stable  Post vital signs: Reviewed and stable  Last Vitals:  Vitals Value Taken Time  BP 129/89 07/31/21 1023  Temp    Pulse 91 07/31/21 1024  Resp 16 07/31/21 1024  SpO2 100 % 07/31/21 1024  Vitals shown include unvalidated device data.  Last Pain:  Vitals:   07/31/21 0536  TempSrc:   PainSc: 0-No pain         Complications: No notable events documented.

## 2021-07-31 NOTE — Discharge Instructions (Signed)

## 2021-07-31 NOTE — Progress Notes (Signed)
Patient ambulated the length of the hallway with Jarrett Soho, [place] without any issues.

## 2021-07-31 NOTE — Interval H&P Note (Signed)
History and Physical Interval Note:  07/31/2021 6:56 AM  Aaron Roberts  has presented today for surgery, with the diagnosis of PROSTATE CANCER.  The various methods of treatment have been discussed with the patient and family. After consideration of risks, benefits and other options for treatment, the patient has consented to  Procedure(s): XI ROBOTIC [place] 2 (N/A) as a surgical intervention.  The patient's history has been reviewed, patient examined, no change in status, stable for surgery.  I have reviewed the patient's chart and labs.  Questions were answered to the patient's satisfaction.     [employer]

## 2021-08-01 ENCOUNTER — Encounter (HOSPITAL_COMMUNITY): Payer: Self-pay | Admitting: Urology

## 2021-08-01 ENCOUNTER — Other Ambulatory Visit: Payer: Self-pay

## 2021-08-01 DIAGNOSIS — C61 Malignant neoplasm of prostate: Secondary | ICD-10-CM | POA: Diagnosis not present

## 2021-08-01 LAB — HEMOGLOBIN AND HEMATOCRIT, BLOOD
HCT: 36.7 % — ABNORMAL LOW (ref 39.0–52.0)
HCT: 36.9 % — ABNORMAL LOW (ref 39.0–52.0)
Hemoglobin: 12 g/dL — ABNORMAL LOW (ref 13.0–17.0)
Hemoglobin: 12 g/dL — ABNORMAL LOW (ref 13.0–17.0)

## 2021-08-01 MED ORDER — BISACODYL 10 MG RE SUPP
10.0000 mg | Freq: Once | RECTAL | Status: AC
  Administered 2021-08-01: 10 mg via RECTAL
  Filled 2021-08-01: qty 1

## 2021-08-01 MED ORDER — TRAMADOL HCL 50 MG PO TABS
50.0000 mg | ORAL_TABLET | Freq: Four times a day (QID) | ORAL | Status: DC | PRN
  Administered 2021-08-01: 50 mg via ORAL
  Filled 2021-08-01: qty 1

## 2021-08-01 NOTE — Progress Notes (Signed)
Patient ID: Aaron Roberts, male   DOB: 03/05/1971, 50 y.o.   MRN: [id number]  1 Day Post-Op Subjective: The patient is doing well.  No nausea or vomiting. Pain is adequately controlled.  Objective: Vital signs in last 24 hours: Temp:  [97.6 F (36.4 C)-100 F (37.8 C)] 98 F (36.7 C) (11/08 0435) Pulse Rate:  [82-103] 84 (11/08 0435) Resp:  [7-18] 18 (11/08 0435) BP: (122-151)/(87-104) 128/94 (11/08 0435) SpO2:  [87 %-100 %] 96 % (11/08 0435)  Intake/Output from previous day: 11/07 0701 - 11/08 0700 In: 3750 [P.O.:200; I.V.:2350; IV Piggyback:1200] Out: 2610 [Urine:2400; Drains:110; Blood:100] Intake/Output this shift: No intake/output data recorded.  Physical Exam:  General: Alert and oriented. CV: RRR Lungs: Clear bilaterally. GI: Soft, Nondistended. Incisions: Clean, dry, and intact Urine: Clear Extremities: Nontender, no erythema, no edema.  Lab Results: Recent Labs    07/31/21 1047 08/01/21 0428  HGB 14.5 12.0*  HCT 44.3 36.7*      Assessment/Plan: POD# 1 s/p robotic prostatectomy.  1) SL IVF 2) Ambulate, Incentive spirometry 3) Transition to oral pain medication 4) Dulcolax suppository 5) D/C pelvic drain 6) Plan for likely discharge later today   Pryor Curia. MD   LOS: 0 days   Dutch Gray 08/01/2021, 7:27 AM

## 2021-08-01 NOTE — Discharge Summary (Signed)
Date of admission: 07/31/2021  Date of discharge: 08/01/2021  Admission diagnosis: prostate cancer   Discharge diagnosis: prostate cancer   Secondary diagnoses:  Patient Active Problem List   Diagnosis Date Noted   Prostate cancer ([place]) 07/31/2021    Procedures performed: Procedure(s): XI ROBOTIC ASSISTED LAPAROSCOPIC RADICAL PROSTATECTOMY LEVEL 2  History and Physical: For full details, please see admission history and physical. Briefly, Aaron Roberts is a 50 y.o. year old patient with history of Gleason 3+4 prostate cancer who presented for planned robotic assisted radical prostatectomy with lymph node dissection and bilateral nerve spare.   Hospital Course: Patient tolerated the procedure well.  He was then transferred to the floor after an uneventful PACU stay.  His hospital course was uncomplicated. He did well overnight and was tolerating clears, ambulating. In the morning his labs showed a Hgb of 12 from 14.5. A repeat hgb was obtained prior to discharge which was stable at 12. His JP output was low and it was removed prior to discharge. On POD#1 he had met discharge criteria: was tolerating clears, was up and ambulating independently,  pain was well controlled, was voiding via his foley catheter, and was ready to for discharge.  Patient was discharged with his cathete to drainage. Urine was clear yellow. Abdominal exam was benign. He was passing gas. He will follow up on 11/15 for foley removal.    Laboratory values:  Recent Labs    07/31/21 1047 08/01/21 0428 08/01/21 1105  HGB 14.5 12.0* 12.0*  HCT 44.3 36.7* 36.9*   No results for input(s): NA, K, CL, CO2, GLUCOSE, BUN, CREATININE, CALCIUM in the last 72 hours. No results for input(s): LABPT, INR in the last 72 hours. No results for input(s): LABURIN in the last 72 hours. Results for orders placed or performed during the hospital encounter of 07/27/21  SARS CORONAVIRUS 2 (TAT 6-24 HRS) Nasopharyngeal Nasopharyngeal Swab      Status: None   Collection Time: 07/27/21 10:13 AM   Specimen: Nasopharyngeal Swab  Result Value Ref Range Status   SARS Coronavirus 2 NEGATIVE NEGATIVE Final    Comment: (NOTE) SARS-CoV-2 target nucleic acids are NOT DETECTED.  The SARS-CoV-2 RNA is generally detectable in upper and lower respiratory specimens during the acute phase of infection. Negative results do not preclude SARS-CoV-2 infection, do not rule out co-infections with other pathogens, and should not be used as the sole basis for treatment or other patient management decisions. Negative results must be combined with clinical observations, patient history, and epidemiological information. The expected result is Negative.  Fact Sheet for Patients: SugarRoll.be  Fact Sheet for Healthcare Providers: https://www.woods-mathews.com/  This test is not yet approved or cleared by the [place] FDA and  has been authorized for detection and/or diagnosis of SARS-CoV-2 by FDA under an Emergency Use Authorization (EUA). This EUA will remain  in effect (meaning this test can be used) for the duration of the COVID-19 declaration under Se ction 564(b)(1) of the Act, 21 U.S.C. section 360bbb-3(b)(1), unless the authorization is terminated or revoked sooner.  Performed at [facility], [place] [address] [place], [place] [address]     Disposition: Home  Discharge instruction: The patient was instructed to be ambulatory but told to refrain from heavy lifting, strenuous activity, or driving.  Discharge medications:  Allergies as of 08/01/2021   No Known Allergies      Medication List     TAKE these medications    docusate sodium 100 MG capsule Commonly  known as: COLACE Take 1 capsule (100 mg total) by mouth 2 (two) times daily.   famotidine 20 MG tablet Commonly known as: PEPCID Take 20 mg by mouth daily.   lisinopril 10 MG tablet Commonly known as: ZESTRIL Take  10 mg by mouth daily.   rosuvastatin 5 MG tablet Commonly known as: CRESTOR Take 5 mg by mouth daily.   sulfamethoxazole-trimethoprim 800-160 MG tablet Commonly known as: BACTRIM DS Take 1 tablet by mouth 2 (two) times daily. Start the day prior to foley removal appointment   traMADol 50 MG tablet Commonly known as: Ultram Take 1-2 tablets (50-100 mg total) by mouth every 6 (six) hours as needed for moderate pain or severe pain.        Followup:   Follow-up Information     Raynelle Bring, MD Follow up on 08/08/2021.   Specialty: Urology Why: at 10:45 Contact information: [place] [address] [phone]

## 2021-08-01 NOTE — Plan of Care (Signed)

## 2021-08-01 NOTE — Progress Notes (Signed)
Patient given discharge, follow up, medication, and foley care instructions with teachback, verbalized understanding, IV removed, personal belongings with patient, spouse to transport home

## 2021-08-04 LAB — SURGICAL PATHOLOGY

## 2021-08-06 ENCOUNTER — Emergency Department (HOSPITAL_BASED_OUTPATIENT_CLINIC_OR_DEPARTMENT_OTHER): Payer: BC Managed Care – PPO

## 2021-08-06 ENCOUNTER — Other Ambulatory Visit: Payer: Self-pay

## 2021-08-06 ENCOUNTER — Encounter (HOSPITAL_BASED_OUTPATIENT_CLINIC_OR_DEPARTMENT_OTHER): Payer: Self-pay | Admitting: Obstetrics and Gynecology

## 2021-08-06 ENCOUNTER — Inpatient Hospital Stay (HOSPITAL_BASED_OUTPATIENT_CLINIC_OR_DEPARTMENT_OTHER)
Admission: EM | Admit: 2021-08-06 | Discharge: 2021-08-09 | DRG: 862 | Disposition: A | Discharge status: Home / Self Care | Payer: BC Managed Care – PPO | Attending: Internal Medicine | Admitting: Internal Medicine

## 2021-08-06 DIAGNOSIS — R197 Diarrhea, unspecified: Secondary | ICD-10-CM | POA: Diagnosis present

## 2021-08-06 DIAGNOSIS — T8144XA Sepsis following a procedure, initial encounter: Secondary | ICD-10-CM | POA: Diagnosis present

## 2021-08-06 DIAGNOSIS — E872 Acidosis, unspecified: Secondary | ICD-10-CM | POA: Diagnosis present

## 2021-08-06 DIAGNOSIS — Z20822 Contact with and (suspected) exposure to covid-19: Secondary | ICD-10-CM | POA: Diagnosis present

## 2021-08-06 DIAGNOSIS — Z9079 Acquired absence of other genital organ(s): Secondary | ICD-10-CM | POA: Diagnosis not present

## 2021-08-06 DIAGNOSIS — A419 Sepsis, unspecified organism: Secondary | ICD-10-CM | POA: Diagnosis present

## 2021-08-06 DIAGNOSIS — Y838 Other surgical procedures as the cause of abnormal reaction of the patient, or of later complication, without mention of misadventure at the time of the procedure: Secondary | ICD-10-CM | POA: Diagnosis present

## 2021-08-06 DIAGNOSIS — A4151 Sepsis due to Escherichia coli [E. coli]: Secondary | ICD-10-CM | POA: Diagnosis present

## 2021-08-06 DIAGNOSIS — T8140XA Infection following a procedure, unspecified, initial encounter: Secondary | ICD-10-CM | POA: Diagnosis present

## 2021-08-06 DIAGNOSIS — N390 Urinary tract infection, site not specified: Secondary | ICD-10-CM | POA: Diagnosis present

## 2021-08-06 DIAGNOSIS — E785 Hyperlipidemia, unspecified: Secondary | ICD-10-CM | POA: Diagnosis present

## 2021-08-06 DIAGNOSIS — K219 Gastro-esophageal reflux disease without esophagitis: Secondary | ICD-10-CM | POA: Diagnosis present

## 2021-08-06 DIAGNOSIS — I10 Essential (primary) hypertension: Secondary | ICD-10-CM | POA: Diagnosis present

## 2021-08-06 DIAGNOSIS — C61 Malignant neoplasm of prostate: Secondary | ICD-10-CM | POA: Diagnosis present

## 2021-08-06 LAB — URINALYSIS, ROUTINE W REFLEX MICROSCOPIC
Bilirubin Urine: NEGATIVE
Bilirubin Urine: NEGATIVE
Glucose, UA: NEGATIVE mg/dL
Glucose, UA: NEGATIVE mg/dL
Ketones, ur: NEGATIVE mg/dL
Ketones, ur: NEGATIVE mg/dL
Nitrite: POSITIVE — AB
Nitrite: POSITIVE — AB
Protein, ur: 100 mg/dL — AB
RBC / HPF: >50 RBC/hpf — ABNORMAL HIGH (ref 0–5)
Specific Gravity, Urine: 1.026 (ref 1.005–1.030)
Specific Gravity, Urine: 1.012 (ref 1.005–1.030)
WBC, UA: >50 WBC/hpf — ABNORMAL HIGH (ref 0–5)
pH: 6 (ref 5.0–8.0)
pH: 5.5 (ref 5.0–8.0)

## 2021-08-06 LAB — CBC WITH DIFFERENTIAL/PLATELET
Abs Immature Granulocytes: 0.07 10*3/uL (ref 0.00–0.07)
Basophils Absolute: 0 10*3/uL (ref 0.0–0.1)
Basophils Relative: 1 %
Eosinophils Absolute: 0 10*3/uL (ref 0.0–0.5)
Eosinophils Relative: 0 %
HCT: 45.4 % (ref 39.0–52.0)
Hemoglobin: 14.9 g/dL (ref 13.0–17.0)
Immature Granulocytes: 2 %
Lymphocytes Relative: 2 %
Lymphs Abs: 0.1 10*3/uL — ABNORMAL LOW (ref 0.7–4.0)
MCH: 29.7 pg (ref 26.0–34.0)
MCHC: 32.8 g/dL (ref 30.0–36.0)
MCV: 90.6 fL (ref 80.0–100.0)
Monocytes Absolute: 0.1 10*3/uL (ref 0.1–1.0)
Monocytes Relative: 2 %
Neutro Abs: 3.9 10*3/uL (ref 1.7–7.7)
Neutrophils Relative %: 93 %
Platelets: 271 10*3/uL (ref 150–400)
RBC: 5.01 MIL/uL (ref 4.22–5.81)
RDW: 12.6 % (ref 11.5–15.5)
WBC: 4.2 10*3/uL (ref 4.0–10.5)
nRBC: 0 % (ref 0.0–0.2)

## 2021-08-06 LAB — RESP PANEL BY RT-PCR (FLU A&B, COVID) ARPGX2
Influenza A by PCR: NEGATIVE
Influenza B by PCR: NEGATIVE
SARS Coronavirus 2 by RT PCR: NEGATIVE

## 2021-08-06 LAB — COMPREHENSIVE METABOLIC PANEL
ALT: 43 U/L (ref 0–44)
AST: 46 U/L — ABNORMAL HIGH (ref 15–41)
Albumin: 4.2 g/dL (ref 3.5–5.0)
Alkaline Phosphatase: 92 U/L (ref 38–126)
Anion gap: 11 (ref 5–15)
BUN: 17 mg/dL (ref 6–20)
CO2: 27 mmol/L (ref 22–32)
Calcium: 9.6 mg/dL (ref 8.9–10.3)
Chloride: 97 mmol/L — ABNORMAL LOW (ref 98–111)
Creatinine, Ser: 1.29 mg/dL — ABNORMAL HIGH (ref 0.61–1.24)
GFR, Estimated: >60 mL/min (ref >60)
Glucose, Bld: 146 mg/dL — ABNORMAL HIGH (ref 70–99)
Potassium: 3.9 mmol/L (ref 3.5–5.1)
Sodium: 135 mmol/L (ref 135–145)
Total Bilirubin: 0.9 mg/dL (ref 0.3–1.2)
Total Protein: 7.9 g/dL (ref 6.5–8.1)

## 2021-08-06 LAB — LACTIC ACID, PLASMA
Lactic Acid, Venous: 2.5 mmol/L (ref 0.5–1.9)
Lactic Acid, Venous: 1.1 mmol/L (ref 0.5–1.9)

## 2021-08-06 LAB — PROTIME-INR
INR: 1.2 (ref 0.8–1.2)
Prothrombin Time: 15.5 seconds — ABNORMAL HIGH (ref 11.4–15.2)

## 2021-08-06 MED ORDER — SODIUM CHLORIDE 0.9 % IV SOLN: INTRAVENOUS | Status: AC

## 2021-08-06 MED ORDER — ACETAMINOPHEN 650 MG RE SUPP: 650.0000 mg | Freq: Four times a day (QID) | RECTAL | Status: DC | PRN

## 2021-08-06 MED ORDER — ROSUVASTATIN CALCIUM 5 MG PO TABS
5.0000 mg | ORAL_TABLET | Freq: Every evening | ORAL | Status: DC
  Administered 2021-08-07 – 2021-08-08 (×2): 5 mg via ORAL
  Filled 2021-08-06 (×2): qty 1

## 2021-08-06 MED ORDER — SODIUM CHLORIDE 0.9 % IV BOLUS
1000.0000 mL | Freq: Once | INTRAVENOUS | Status: AC
  Administered 2021-08-06: 1000 mL via INTRAVENOUS

## 2021-08-06 MED ORDER — ACETAMINOPHEN 325 MG PO TABS
650.0000 mg | ORAL_TABLET | Freq: Four times a day (QID) | ORAL | Status: DC | PRN
  Administered 2021-08-06 – 2021-08-07 (×2): 650 mg via ORAL
  Filled 2021-08-06 (×2): qty 2

## 2021-08-06 MED ORDER — ENOXAPARIN SODIUM 40 MG/0.4ML IJ SOSY
40.0000 mg | PREFILLED_SYRINGE | INTRAMUSCULAR | Status: DC
  Administered 2021-08-06 – 2021-08-07 (×2): 40 mg via SUBCUTANEOUS
  Filled 2021-08-06 (×2): qty 0.4

## 2021-08-06 MED ORDER — SODIUM CHLORIDE 0.9 % IV SOLN
2.0000 g | INTRAVENOUS | Status: DC
  Administered 2021-08-07 – 2021-08-08 (×2): 2 g via INTRAVENOUS
  Filled 2021-08-06 (×3): qty 20

## 2021-08-06 MED ORDER — SODIUM CHLORIDE 0.9 % IV SOLN
2.0000 g | Freq: Once | INTRAVENOUS | Status: AC
  Administered 2021-08-06: 2 g via INTRAVENOUS
  Filled 2021-08-06: qty 20

## 2021-08-06 MED ORDER — SODIUM CHLORIDE 0.9 % IV BOLUS
1500.0000 mL | Freq: Once | INTRAVENOUS | Status: AC
  Administered 2021-08-06: 1000 mL via INTRAVENOUS

## 2021-08-06 MED ORDER — FAMOTIDINE 20 MG PO TABS
20.0000 mg | ORAL_TABLET | Freq: Every evening | ORAL | Status: DC
  Administered 2021-08-07 – 2021-08-08 (×2): 20 mg via ORAL
  Filled 2021-08-06 (×2): qty 1

## 2021-08-06 NOTE — ED Notes (Signed)
RT Note: EKG obtained/labelled/Given to MD.

## 2021-08-06 NOTE — Plan of Care (Signed)
[place]: [facility] Requesting Physician/APP: Caryl Ada, PA  History:  Aaron Roberts is a 63M with history of prostatic adenocarcinoma s/p prostatectomy 07/31/2021 by Dr. Alinda Money discharged home with Foley catheter in place presented to [place] with fevers and chills.  Urinalysis consistent with UTI which is complicated with current Foley catheter in place and recent instrumentation with prostatectomy.  Patient received 2.5 L NS bolus followed by ceftriaxone 2 g IV x1.  Urology, Dr. Diona Fanti was consulted and service will follow during hospitalization.  Plan of Care: Accepted to telemetry bed at [facility] long.   Please call the [facility] [place] number at the top of Amion at the time of the patient's arrival to the floor.  Nelia Rogoff [place] DO

## 2021-08-06 NOTE — ED Provider Notes (Signed)
[facility] EMERGENCY DEPT Provider Note   CSN: [id number] Arrival date & time: 08/06/21  1110     History Chief Complaint  Patient presents with   Fever    Aaron Roberts is a 50 y.o. male.  Pt had a robotic prostectomy on 11/7. Pt began having fever and chills today.  Pt spoke to Urology who advised him to come in.  Pt denies vomiting.    The history is provided by the patient. No language interpreter was used.  Fever Max temp prior to arrival:  103 Temp source:  Oral Severity:  Severe Onset quality:  Sudden Duration:  1 day Timing:  Constant Progression:  Worsening Chronicity:  New Relieved by:  Acetaminophen Worsened by:  Nothing Ineffective treatments:  None tried Associated symptoms: chills and myalgias   Risk factors: no sick contacts   Risk factors comment:  Recent surgery     Past Medical History:  Diagnosis Date   Allergy    seasonal   GERD (gastroesophageal reflux disease)    Hyperlipidemia    Hypertension     Patient Active Problem List   Diagnosis Date Noted   Prostate cancer ([place]) 07/31/2021    Past Surgical History:  Procedure Laterality Date   ROBOT ASSISTED LAPAROSCOPIC RADICAL PROSTATECTOMY N/A 07/31/2021   Procedure: XI ROBOTIC ASSISTED LAPAROSCOPIC RADICAL PROSTATECTOMY LEVEL 2;  Surgeon: Raynelle Bring, MD;  Location: [facility];  Service: Urology;  Laterality: N/A;   VASECTOMY     WISDOM TOOTH EXTRACTION         Family History  Problem Relation Age of Onset   Colon cancer Neg Hx    Esophageal cancer Neg Hx    Rectal cancer Neg Hx    Stomach cancer Neg Hx     Social History   Tobacco Use   Smoking status: Former   Smokeless tobacco: Current    Types: Snuff   Tobacco comments:    Quit 20 years ago  Vaping Use   Vaping Use: Never used  Substance Use Topics   Alcohol use: Yes    Comment: occasional beer   Drug use: Never    Home Medications Prior to Admission medications   Medication Sig Start Date End  Date Taking? Authorizing Provider  docusate sodium (COLACE) 100 MG capsule Take 1 capsule (100 mg total) by mouth 2 (two) times daily. 07/31/21   Debbrah Alar, PA-C  famotidine (PEPCID) 20 MG tablet Take 20 mg by mouth daily.    [provider]  lisinopril (ZESTRIL) 10 MG tablet Take 10 mg by mouth daily.    [provider]  rosuvastatin (CRESTOR) 5 MG tablet Take 5 mg by mouth daily.    [provider]  sulfamethoxazole-trimethoprim (BACTRIM DS) 800-160 MG tablet Take 1 tablet by mouth 2 (two) times daily. Start the day prior to foley removal appointment 07/31/21   Debbrah Alar, PA-C  traMADol (ULTRAM) 50 MG tablet Take 1-2 tablets (50-100 mg total) by mouth every 6 (six) hours as needed for moderate pain or severe pain. 07/31/21   Debbrah Alar, PA-C    Allergies    Patient has no known allergies.  Review of Systems   Review of Systems  Constitutional:  Positive for chills and fever.  Musculoskeletal:  Positive for myalgias.  All other systems reviewed and are negative.  Physical Exam Updated Vital Signs BP (!) 140/98 (BP Location: Right Arm)   Pulse (!) 113   Temp 99.2 F (37.3 C)   Resp 18  SpO2 96%   Physical Exam Vitals and nursing note reviewed.  Constitutional:      Appearance: He is well-developed.  HENT:     Head: Normocephalic and atraumatic.     Right Ear: Tympanic membrane normal.     Left Ear: Tympanic membrane normal.     Mouth/Throat:     Mouth: Mucous membranes are moist.  Eyes:     Conjunctiva/sclera: Conjunctivae normal.     Pupils: Pupils are equal, round, and reactive to light.  Cardiovascular:     Rate and Rhythm: Normal rate and regular rhythm.     Pulses: Normal pulses.     Heart sounds: Normal heart sounds. No murmur heard. Pulmonary:     Effort: Pulmonary effort is normal. No respiratory distress.     Breath sounds: Normal breath sounds.  Abdominal:     Palpations: Abdomen is soft.     Tenderness: There is no  abdominal tenderness.  Musculoskeletal:        General: Normal range of motion.     Cervical back: Normal range of motion and neck supple.  Skin:    General: Skin is warm and dry.  Neurological:     General: No focal deficit present.     Mental Status: He is alert and oriented to person, place, and time.  Psychiatric:        Mood and Affect: Mood normal.    ED Results / Procedures / Treatments   Labs (all labs ordered are listed, but only abnormal results are displayed) Labs Reviewed  COMPREHENSIVE METABOLIC PANEL - Abnormal; Notable for the following components:      Result Value   Chloride 97 (*)    Glucose, Bld 146 (*)    Creatinine, Ser 1.29 (*)    AST 46 (*)    All other components within normal limits  LACTIC ACID, PLASMA - Abnormal; Notable for the following components:   Lactic Acid, Venous 2.5 (*)    All other components within normal limits  CBC WITH DIFFERENTIAL/PLATELET - Abnormal; Notable for the following components:   Lymphs Abs 0.1 (*)    All other components within normal limits  PROTIME-INR - Abnormal; Notable for the following components:   Prothrombin Time 15.5 (*)    All other components within normal limits  URINALYSIS, ROUTINE W REFLEX MICROSCOPIC - Abnormal; Notable for the following components:   APPearance HAZY (*)    Hgb urine dipstick LARGE (*)    Protein, ur 100 (*)    Nitrite POSITIVE (*)    Leukocytes,Ua LARGE (*)    RBC / HPF >50 (*)    WBC, UA >50 (*)    Bacteria, UA MANY (*)    All other components within normal limits  URINALYSIS, ROUTINE W REFLEX MICROSCOPIC - Abnormal; Notable for the following components:   APPearance HAZY (*)    Hgb urine dipstick LARGE (*)    Protein, ur TRACE (*)    Nitrite POSITIVE (*)    Leukocytes,Ua LARGE (*)    Bacteria, UA MANY (*)    All other components within normal limits  RESP PANEL BY RT-PCR (FLU A&B, COVID) ARPGX2  CULTURE, BLOOD (ROUTINE X 2)  CULTURE, BLOOD (ROUTINE X 2)  URINE CULTURE   LACTIC ACID, PLASMA    EKG None  Radiology No results found.  Procedures .Critical Care Performed by: Fransico Meadow, PA-C Authorized by: Fransico Meadow, PA-C   Critical care provider statement:    Critical care time (minutes):  30   Critical care start time:  08/06/2021 12:45 PM   Critical care end time:  08/06/2021 3:03 PM   Critical care was necessary to treat or prevent imminent or life-threatening deterioration of the following conditions:  Dehydration   Critical care was time spent personally by me on the following activities:  Development of treatment plan with patient or surrogate, discussions with consultants, evaluation of patient's response to treatment, examination of patient, ordering and review of laboratory studies, ordering and review of radiographic studies, ordering and performing treatments and interventions, pulse oximetry, re-evaluation of patient's condition and review of old charts   Care discussed with: admitting provider     Medications Ordered in ED Medications  sodium chloride 0.9 % bolus 1,000 mL (1,000 mLs Intravenous New Bag/Given 08/06/21 1226)  cefTRIAXone (ROCEPHIN) 2 g in sodium chloride 0.9 % 100 mL IVPB (2 g Intravenous New Bag/Given 08/06/21 1250)  sodium chloride 0.9 % bolus 1,500 mL (1,000 mLs Intravenous New Bag/Given 08/06/21 1337)    ED Course  I have reviewed the triage vital signs and the nursing notes.  Pertinent labs & imaging results that were available during my care of the patient were reviewed by me and considered in my medical decision making (see chart for details).    MDM Rules/Calculators/A&P                           MDM:  Pt given Iv fluid bolus and Rocephin 2 gram IV.   Lactic acid is 2.5  Ua shows 11-20 wbc's and rbc.  Urine culture pending.  I spoke to Dr. Diona Fanti.  He request medicine admission for antibiotics.  [facility] if possible  Final Clinical Impression(s) / ED Diagnoses Final diagnoses:   Sepsis without acute organ dysfunction, due to unspecified organism [facility])    Rx / [place] Orders ED Discharge Orders     None        Sidney Ace 08/06/21 1612    Pattricia Boss, MD 08/08/21 1706

## 2021-08-06 NOTE — ED Triage Notes (Signed)
Patient reports he recently had a prostatectomy on Monday. Patient reports yesterday he started having fevers and chills. Patient reports fever of 103.2 at home

## 2021-08-06 NOTE — H&P (Signed)
History and Physical    Aaron Roberts IRS:[id number] DOB: 1970/11/01 DOA: 08/06/2021  PCP: Beola Cord, FNP Patient coming from: [place] ED  Chief Complaint: Fever and chills  HPI: Aaron Roberts is a 50 y.o. 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  Tachycardic.  Labs showing WBC 4.2, hemoglobin 14.9, platelet count 271k.  Sodium 135, potassium 3.9, chloride 97, bicarb 27, BUN 17, creatinine 1.2, glucose 146.  Lactic acid 2.5 >1.1.  INR 1.2.  UA with positive nitrite, large amount of leukocytes, greater than 50 RBCs, greater than 50 WBCs, and many bacteria.  Urine culture pending. Blood cultures drawn.  COVID and influenza PCR negative.  Chest x-ray not suggestive of pneumonia. Urology Dr. Diona Fanti was consulted and their service will follow during hospitalization.  Patient was given ceftriaxone and 2.5 L fluid boluses.  Patient states he was discharged with a Foley catheter and is supposed to get it removed in 2 days.  Yesterday he started having fevers and chills.  His wife checked his temperature and it was 59 F.  Denies nausea, vomiting, or abdominal pain.  No other complaints.  Denies cough, shortness of breath, or chest pain.  Review of Systems:  All systems reviewed and apart from history of presenting illness, are negative.  Past Medical History:  Diagnosis Date  . Allergy    seasonal  . GERD (gastroesophageal reflux disease)   . Hyperlipidemia   . Hypertension     Past Surgical History:  Procedure Laterality Date  . ROBOT ASSISTED LAPAROSCOPIC RADICAL PROSTATECTOMY N/A 07/31/2021   Procedure: XI ROBOTIC ASSISTED LAPAROSCOPIC RADICAL PROSTATECTOMY LEVEL 2;  Surgeon: Raynelle Bring, MD;  Location: [facility];  Service: Urology;  Laterality: N/A;  . VASECTOMY    . WISDOM  TOOTH EXTRACTION       reports that he has quit smoking. His smokeless tobacco use includes snuff. He reports current alcohol use. He reports that he does not use drugs.  No Known Allergies  Family History  Problem Relation Age of Onset  . Colon cancer Neg Hx   . Esophageal cancer Neg Hx   . Rectal cancer Neg Hx   . Stomach cancer Neg Hx     Prior to Admission medications   Medication Sig Start Date End Date Taking? Authorizing Provider  docusate sodium (COLACE) 100 MG capsule Take 1 capsule (100 mg total) by mouth 2 (two) times daily. 07/31/21   Debbrah Alar, PA-C  famotidine (PEPCID) 20 MG tablet Take 20 mg by mouth daily.    [provider]  lisinopril (ZESTRIL) 10 MG tablet Take 10 mg by mouth daily.    [provider]  rosuvastatin (CRESTOR) 5 MG tablet Take 5 mg by mouth daily.    [provider]  sulfamethoxazole-trimethoprim (BACTRIM DS) 800-160 MG tablet Take 1 tablet by mouth 2 (two) times daily. Start the day prior to foley removal appointment 07/31/21   Debbrah Alar, PA-C  traMADol (ULTRAM) 50 MG tablet Take 1-2 tablets (50-100 mg total) by mouth every 6 (six) hours as needed for moderate pain or severe pain. 07/31/21   Debbrah Alar, PA-C    Physical Exam: Vitals:   08/06/21 1330 08/06/21 1400 08/06/21 1430 08/06/21 1735  BP: (!) 137/101 (!) 133/97 (!) 140/98 (!) 156/108  Pulse: (!) 117 (!) 113 (!) 113 94  Resp: 16 16 18  16  Temp:    98.3 F (36.8 C)  TempSrc:    Oral  SpO2: 94% 93% 96% 99%  Weight:    75 kg  Height:    5\' 8"  (1.727 m)    Physical Exam Constitutional:      General: He is not in acute distress.    Comments: Watching television  HENT:     Head: Normocephalic and atraumatic.  Eyes:     Extraocular Movements: Extraocular movements intact.     Conjunctiva/sclera: Conjunctivae normal.  Cardiovascular:     Rate and Rhythm: Normal rate and regular rhythm.     Pulses: Normal pulses.  Pulmonary:     Effort: Pulmonary  effort is normal. No respiratory distress.     Breath sounds: Normal breath sounds. No wheezing or rales.  Abdominal:     General: Bowel sounds are normal. There is no distension.     Palpations: Abdomen is soft.     Tenderness: There is no abdominal tenderness.  Musculoskeletal:        General: No swelling or tenderness.     Cervical back: Normal range of motion and neck supple.  Skin:    General: Skin is warm and dry.  Neurological:     General: No focal deficit present.     Mental Status: He is alert and oriented to person, place, and time.     Labs on Admission: I have personally reviewed following labs and imaging studies  CBC: Recent Labs  Lab 07/31/21 1047 08/01/21 0428 08/01/21 1105 08/06/21 1202  WBC  --   --   --  4.2  NEUTROABS  --   --   --  3.9  HGB 14.5 12.0* 12.0* 14.9  HCT 44.3 36.7* 36.9* 45.4  MCV  --   --   --  90.6  PLT  --   --   --  710   Basic Metabolic Panel: Recent Labs  Lab 08/06/21 1202  NA 135  K 3.9  CL 97*  CO2 27  GLUCOSE 146*  BUN 17  CREATININE 1.29*  CALCIUM 9.6   GFR: Estimated Creatinine Clearance: 64.8 mL/min (A) (by C-G formula based on SCr of 1.29 mg/dL (H)). Liver Function Tests: Recent Labs  Lab 08/06/21 1202  AST 46*  ALT 43  ALKPHOS 92  BILITOT 0.9  PROT 7.9  ALBUMIN 4.2   No results for input(s): LIPASE, AMYLASE in the last 168 hours. No results for input(s): AMMONIA in the last 168 hours. Coagulation Profile: Recent Labs  Lab 08/06/21 1202  INR 1.2   Cardiac Enzymes: No results for input(s): CKTOTAL, CKMB, CKMBINDEX, TROPONINI in the last 168 hours. BNP (last 3 results) No results for input(s): PROBNP in the last 8760 hours. HbA1C: No results for input(s): HGBA1C in the last 72 hours. CBG: No results for input(s): GLUCAP in the last 168 hours. Lipid Profile: No results for input(s): CHOL, HDL, LDLCALC, TRIG, CHOLHDL, LDLDIRECT in the last 72 hours. Thyroid Function Tests: No results for input(s):  TSH, T4TOTAL, FREET4, T3FREE, THYROIDAB in the last 72 hours. Anemia Panel: No results for input(s): VITAMINB12, FOLATE, FERRITIN, TIBC, IRON, RETICCTPCT in the last 72 hours. Urine analysis:    Component Value Date/Time   COLORURINE YELLOW 08/06/2021 1234   APPEARANCEUR HAZY (A) 08/06/2021 1234   LABSPEC 1.012 08/06/2021 1234   PHURINE 5.5 08/06/2021 1234   GLUCOSEU NEGATIVE 08/06/2021 1234   HGBUR LARGE (A) 08/06/2021 1234   BILIRUBINUR NEGATIVE 08/06/2021 1234   KETONESUR  NEGATIVE 08/06/2021 1234   PROTEINUR TRACE (A) 08/06/2021 1234   NITRITE POSITIVE (A) 08/06/2021 1234   LEUKOCYTESUR LARGE (A) 08/06/2021 1234    Radiological Exams on Admission: DG Chest Port 1 View  Result Date: 08/06/2021 CLINICAL DATA:  Fever EXAM: PORTABLE CHEST 1 VIEW COMPARISON:  None. FINDINGS: Linear bibasilar opacities could reflect scarring or atelectasis. Heart is normal size. No effusions. No acute bony abnormality. IMPRESSION: Bibasilar scarring or atelectasis.  No active disease. Electronically Signed   By: Rolm Baptise M.D.   On: 08/06/2021 15:26    EKG: Independently reviewed.  Sinus tachycardia, nonspecific T wave abnormality.  No significant change since prior tracing.  Assessment/Plan Principal Problem:   Complicated UTI (urinary tract infection) Active Problems:   Prostate cancer ([place])   Sepsis ([place])   Essential hypertension   Hyperlipidemia   Sepsis secondary to complicated UTI In the setting of recent instrumentation for prostatectomy and indwelling Foley catheter.  Meets criteria for sepsis with fevers at home, tachycardia, and lactic acidosis.  UA suggestive of infection.  Patient was given ceftriaxone and 2.5 L fluid boluses in the ED.  Lactic acidosis and tachycardia have resolved. -Continue ceftriaxone and maintenance IV fluid.  Urine and blood cultures pending.  Monitor WBC count.  Defer Foley management to urology.  Prostatic adenocarcinoma status post prostatectomy  07/31/2021 Admitted for complicated UTI. -Urology consulted  Hypertension -Hold antihypertensives at this time in setting of sepsis  Hyperlipidemia -Continue Crestor  GERD -Continue Pepcid  DVT prophylaxis: Lovenox Code Status: Full code Family Communication: No family available at this time. Disposition Plan: Status is: Inpatient  Remains inpatient appropriate because: Sepsis secondary to complicated UTI  Level of care: Level of care: Telemetry  The medical decision making on this patient was of high complexity and the patient is at high risk for clinical deterioration, therefore this is a level 3 visit.  Shela Leff MD [facility]  If 7PM-7AM, please contact night-coverage [email or web address]  08/06/2021, 7:44 PM

## 2021-08-06 NOTE — ED Notes (Signed)
CRITICAL VALUE STICKER  CRITICAL VALUE:Lactic acid 2.5  RECEIVER (on-site recipient of call):Shawnie Pons, RN  DATE & TIME NOTIFIED: 08-06-2021 1240  MESSENGER (representative from lab):  MD NOTIFIED: P.A. Santiago Glad  TIME OF NOTIFICATION:1240  RESPONSE:

## 2021-08-06 NOTE — ED Notes (Signed)
Report called to [employer] [facility] [place]. Report called to [facility] .

## 2021-08-07 DIAGNOSIS — N390 Urinary tract infection, site not specified: Secondary | ICD-10-CM | POA: Diagnosis not present

## 2021-08-07 LAB — BLOOD CULTURE ID PANEL (REFLEXED) - BCID2

## 2021-08-07 LAB — CBC
HCT: 38.3 % — ABNORMAL LOW (ref 39.0–52.0)
Hemoglobin: 12.5 g/dL — ABNORMAL LOW (ref 13.0–17.0)
MCH: 29.7 pg (ref 26.0–34.0)
MCHC: 32.6 g/dL (ref 30.0–36.0)
MCV: 91 fL (ref 80.0–100.0)
Platelets: 234 10*3/uL (ref 150–400)
RBC: 4.21 MIL/uL — ABNORMAL LOW (ref 4.22–5.81)
RDW: 12.7 % (ref 11.5–15.5)
WBC: 4.9 10*3/uL (ref 4.0–10.5)
nRBC: 0 % (ref 0.0–0.2)

## 2021-08-07 LAB — HIV ANTIBODY (ROUTINE TESTING W REFLEX): HIV Screen 4th Generation wRfx: NONREACTIVE

## 2021-08-07 MED ORDER — CHLORHEXIDINE GLUCONATE CLOTH 2 % EX PADS
6.0000 | MEDICATED_PAD | Freq: Every day | CUTANEOUS | Status: DC
  Administered 2021-08-07: 6 via TOPICAL

## 2021-08-07 MED ORDER — WITCH HAZEL-GLYCERIN EX PADS
MEDICATED_PAD | CUTANEOUS | Status: DC | PRN
  Filled 2021-08-07: qty 100

## 2021-08-07 MED ORDER — LISINOPRIL 20 MG PO TABS
20.0000 mg | ORAL_TABLET | Freq: Every evening | ORAL | Status: DC
  Administered 2021-08-07 – 2021-08-08 (×2): 20 mg via ORAL
  Filled 2021-08-07 (×2): qty 1

## 2021-08-07 NOTE — Progress Notes (Signed)
PROGRESS NOTE    Aaron Roberts  YYT:[id number] DOB: 1971-01-18 DOA: 08/06/2021 PCP: Beola Cord, FNP   Chief Complain: Fever and chills  Brief Narrative: Patient is a 50 year old male with history of prostatic adenocarcinoma status post prostatectomy on 07/31/2021 and was discharged home with Foley catheter, hypertension, hyperlipidemia, GERD who presented to the emergency part with complaints of fever and chills.  He was tachycardic on presentation.  Lactic acid is elevated at 2.5.  UA was positive with nitrate, large amount of leukocytes, red blood cells, WBC more than 50, many bacteria.  Patient was admitted for the management of possible sepsis secondary to UTI.  Blood culture, urine culture sent.  Currently on ceftriaxone.  Urology following.  Assessment & Plan:   Principal Problem:   Complicated UTI (urinary tract infection) Active Problems:   Prostate cancer ([place])   Sepsis ([place])   Essential hypertension   Hyperlipidemia   Sepsis secondary to complicated UTI/bacteremia: Recent history of prostatectomy for prostatic adenocarcinoma.  Was recently discharged home with Foley catheter.  Presented with fever, chills, tachycardia, lactic acidosis.  High suspicion for sepsis.  Continue IV fluids, currently on ceftriaxone.    Sepsis physiology has improved. Anaerobic bottle is now showing gram-negative rods/E. coli.  We will follow-up final blood culture, urine culture.  Prostatic adenocarcinoma: Status post prostatectomy on 07/31/2021.  Urology consulted and following.  Hypertension:: On presentation, antihypertensives not started due to suspicion for sepsis.  Currently he is hypertensive.  We will restart his home medications  Hyperlipidemia: On Crestor  GERD: On Pepcid  Diarrhea: Denies any abdominal pain, nausea or vomiting.  He says he develops diarrhea sometimes when he gets IV fluids.  Low suspicion for infectious etiology.  Will not send Cdiff or  GI pathogen  panel.           DVT prophylaxis:Lovenox Code Status: Full Family Communication: Discussed with wife at bedside Status is: Inpatient  Remains inpatient appropriate because: Currently on IV antibiotics for sepsis/bacteremia      Consultants: Urology  Procedures: None  Antimicrobials:  Anti-infectives (From admission, onward)    Start     Dose/Rate Route Frequency Ordered Stop   08/07/21 1200  cefTRIAXone (ROCEPHIN) 2 g in sodium chloride 0.9 % 100 mL IVPB        2 g 200 mL/hr over 30 Minutes Intravenous Every 24 hours 08/06/21 1942     08/06/21 1245  cefTRIAXone (ROCEPHIN) 2 g in sodium chloride 0.9 % 100 mL IVPB        2 g 200 mL/hr over 30 Minutes Intravenous  Once 08/06/21 1233 08/06/21 1726       Subjective:  Patient seen and examined at the bedside this morning.  He feels  comfortable today.  Denies any abdominal pain, nausea or vomiting or fever.  Feels better.   Objective: Vitals:   08/06/21 1735 08/06/21 2125 08/07/21 0119 08/07/21 0507  BP: (!) 156/108 (!) 151/97 (!) 137/96 (!) 151/97  Pulse: 94 88 84 87  Resp: 16 18 17    Temp: 98.3 F (36.8 C) 98 F (36.7 C) 98.4 F (36.9 C) 98.6 F (37 C)  TempSrc: Oral Oral Oral Oral  SpO2: 99% 100% 99% 94%  Weight: 75 kg     Height: 5\' 8"  (1.727 m)       Intake/Output Summary (Last 24 hours) at 08/07/2021 1132 Last data filed at 08/07/2021 1000 Gross per 24 hour  Intake 3508.19 ml  Output 1000 ml  Net 2508.19 ml  Filed Weights   08/06/21 1735  Weight: 75 kg    Examination:  General exam: Overall comfortable, not in distress HEENT: PERRL Respiratory system:  no wheezes or crackles  Cardiovascular system: S1 & S2 heard, RRR.  Gastrointestinal system: Abdomen is nondistended, soft and nontender. Central nervous system: Alert and oriented Extremities: No edema, no clubbing ,no cyanosis Skin: No rashes, no ulcers,no icterus   GU: Foley   Data Reviewed: I have personally reviewed following  labs and imaging studies  CBC: Recent Labs  Lab 08/01/21 0428 08/01/21 1105 08/06/21 1202 08/07/21 0521  WBC  --   --  4.2 4.9  NEUTROABS  --   --  3.9  --   HGB 12.0* 12.0* 14.9 12.5*  HCT 36.7* 36.9* 45.4 38.3*  MCV  --   --  90.6 91.0  PLT  --   --  271 601   Basic Metabolic Panel: Recent Labs  Lab 08/06/21 1202  NA 135  K 3.9  CL 97*  CO2 27  GLUCOSE 146*  BUN 17  CREATININE 1.29*  CALCIUM 9.6   GFR: Estimated Creatinine Clearance: 64.8 mL/min (A) (by C-G formula based on SCr of 1.29 mg/dL (H)). Liver Function Tests: Recent Labs  Lab 08/06/21 1202  AST 46*  ALT 43  ALKPHOS 92  BILITOT 0.9  PROT 7.9  ALBUMIN 4.2   No results for input(s): LIPASE, AMYLASE in the last 168 hours. No results for input(s): AMMONIA in the last 168 hours. Coagulation Profile: Recent Labs  Lab 08/06/21 1202  INR 1.2   Cardiac Enzymes: No results for input(s): CKTOTAL, CKMB, CKMBINDEX, TROPONINI in the last 168 hours. BNP (last 3 results) No results for input(s): PROBNP in the last 8760 hours. HbA1C: No results for input(s): HGBA1C in the last 72 hours. CBG: No results for input(s): GLUCAP in the last 168 hours. Lipid Profile: No results for input(s): CHOL, HDL, LDLCALC, TRIG, CHOLHDL, LDLDIRECT in the last 72 hours. Thyroid Function Tests: No results for input(s): TSH, T4TOTAL, FREET4, T3FREE, THYROIDAB in the last 72 hours. Anemia Panel: No results for input(s): VITAMINB12, FOLATE, FERRITIN, TIBC, IRON, RETICCTPCT in the last 72 hours. Sepsis Labs: Recent Labs  Lab 08/06/21 1202 08/06/21 1425  LATICACIDVEN 2.5* 1.1    Recent Results (from the past 240 hour(s))  Culture, blood (Routine x 2)     Status: None (Preliminary result)   Collection Time: 08/06/21 12:00 PM   Specimen: BLOOD  Result Value Ref Range Status   Specimen Description   Final    BLOOD LEFT ANTECUBITAL Performed at [facility], [address] [place], [place] [address]     Special Requests   Final    BOTTLES DRAWN AEROBIC AND ANAEROBIC Blood Culture results may not be optimal due to an excessive volume of blood received in culture bottles Performed at [facility], [address], [place], [place] [address]    Culture  Setup Time   Final    GRAM NEGATIVE RODS ANAEROBIC BOTTLE ONLY CRITICAL RESULT CALLED TO, READ BACK BY AND VERIFIED WITH: M LILLISTON,PHARMD@[id number]  08/07/21 [place] Performed at [facility], [place] [address], [place], [place] [address]    Culture GRAM NEGATIVE RODS  Final   Report Status PENDING  Incomplete  Blood Culture ID Panel (Reflexed)     Status: Abnormal   Collection Time: 08/06/21 12:00 PM  Result Value Ref Range Status   Enterococcus faecalis NOT DETECTED NOT DETECTED Final   Enterococcus Faecium NOT  DETECTED NOT DETECTED Final   Listeria monocytogenes NOT DETECTED NOT DETECTED Final   Staphylococcus species NOT DETECTED NOT DETECTED Final   Staphylococcus aureus (BCID) NOT DETECTED NOT DETECTED Final   Staphylococcus epidermidis NOT DETECTED NOT DETECTED Final   Staphylococcus lugdunensis NOT DETECTED NOT DETECTED Final   Streptococcus species NOT DETECTED NOT DETECTED Final   Streptococcus agalactiae NOT DETECTED NOT DETECTED Final   Streptococcus pneumoniae NOT DETECTED NOT DETECTED Final   Streptococcus pyogenes NOT DETECTED NOT DETECTED Final   A.calcoaceticus-baumannii NOT DETECTED NOT DETECTED Final   Bacteroides fragilis NOT DETECTED NOT DETECTED Final   Enterobacterales DETECTED (A) NOT DETECTED Final    Comment: Enterobacterales represent a large order of gram negative bacteria, not a single organism. CRITICAL RESULT CALLED TO, READ BACK BY AND VERIFIED WITH: M LILLISTON,PHARMD@[id number]  08/07/21 [place]    Enterobacter cloacae complex NOT DETECTED NOT DETECTED Final   Escherichia coli DETECTED (A) NOT DETECTED Final    Comment: CRITICAL RESULT CALLED TO, READ BACK BY AND VERIFIED WITH: M  LILLISTON,PHARMD@[id number]  08/07/21 [place]    Klebsiella aerogenes NOT DETECTED NOT DETECTED Final   Klebsiella oxytoca NOT DETECTED NOT DETECTED Final   Klebsiella pneumoniae NOT DETECTED NOT DETECTED Final   Proteus species NOT DETECTED NOT DETECTED Final   Salmonella species NOT DETECTED NOT DETECTED Final   Serratia marcescens NOT DETECTED NOT DETECTED Final   Haemophilus influenzae NOT DETECTED NOT DETECTED Final   Neisseria meningitidis NOT DETECTED NOT DETECTED Final   Pseudomonas aeruginosa NOT DETECTED NOT DETECTED Final   Stenotrophomonas maltophilia NOT DETECTED NOT DETECTED Final   Candida albicans NOT DETECTED NOT DETECTED Final   Candida auris NOT DETECTED NOT DETECTED Final   Candida glabrata NOT DETECTED NOT DETECTED Final   Candida krusei NOT DETECTED NOT DETECTED Final   Candida parapsilosis NOT DETECTED NOT DETECTED Final   Candida tropicalis NOT DETECTED NOT DETECTED Final   Cryptococcus neoformans/gattii NOT DETECTED NOT DETECTED Final   CTX-M ESBL NOT DETECTED NOT DETECTED Final   Carbapenem resistance IMP NOT DETECTED NOT DETECTED Final   Carbapenem resistance KPC NOT DETECTED NOT DETECTED Final   Carbapenem resistance NDM NOT DETECTED NOT DETECTED Final   Carbapenem resist OXA 48 LIKE NOT DETECTED NOT DETECTED Final   Carbapenem resistance VIM NOT DETECTED NOT DETECTED Final    Comment: Performed at [facility], [address], [place], [place] [address]  Culture, blood (Routine x 2)     Status: None (Preliminary result)   Collection Time: 08/06/21 12:20 PM   Specimen: BLOOD  Result Value Ref Range Status   Specimen Description   Final    BLOOD RIGHT ANTECUBITAL Performed at [facility], [address] [place], [place] [address]    Special Requests   Final    BOTTLES DRAWN AEROBIC AND ANAEROBIC Blood Culture adequate volume Performed at [facility], [address] [place], [place] [address]    Culture   Final     NO GROWTH < 24 HOURS Performed at [facility], [place] [address] [place], [place] [address]    Report Status PENDING  Incomplete  Resp Panel by RT-PCR (Flu A&B, Covid) Nasopharyngeal Swab     Status: None   Collection Time: 08/06/21  1:34 PM   Specimen: Nasopharyngeal Swab; Nasopharyngeal(NP) swabs in vial transport medium  Result Value Ref Range Status   SARS Coronavirus 2 by RT PCR NEGATIVE NEGATIVE Final    Comment: (NOTE) SARS-CoV-2 target nucleic acids are  NOT DETECTED.  The SARS-CoV-2 RNA is generally detectable in upper respiratory specimens during the acute phase of infection. The lowest concentration of SARS-CoV-2 viral copies this assay can detect is 138 copies/mL. A negative result does not preclude SARS-Cov-2 infection and should not be used as the sole basis for treatment or other patient management decisions. A negative result may occur with  improper specimen collection/handling, submission of specimen other than nasopharyngeal swab, presence of viral mutation(s) within the areas targeted by this assay, and inadequate number of viral copies(<138 copies/mL). A negative result must be combined with clinical observations, patient history, and epidemiological information. The expected result is Negative.  Fact Sheet for Patients:  EntrepreneurPulse.com.au  Fact Sheet for Healthcare Providers:  IncredibleEmployment.be  This test is no t yet approved or cleared by the [place] FDA and  has been authorized for detection and/or diagnosis of SARS-CoV-2 by FDA under an Emergency Use Authorization (EUA). This EUA will remain  in effect (meaning this test can be used) for the duration of the COVID-19 declaration under Section 564(b)(1) of the Act, 21 U.S.C.section 360bbb-3(b)(1), unless the authorization is terminated  or revoked sooner.       Influenza A by PCR NEGATIVE NEGATIVE Final   Influenza B by PCR NEGATIVE NEGATIVE  Final    Comment: (NOTE) The Xpert Xpress SARS-CoV-2/FLU/RSV plus assay is intended as an aid in the diagnosis of influenza from Nasopharyngeal swab specimens and should not be used as a sole basis for treatment. Nasal washings and aspirates are unacceptable for Xpert Xpress SARS-CoV-2/FLU/RSV testing.  Fact Sheet for Patients: EntrepreneurPulse.com.au  Fact Sheet for Healthcare Providers: IncredibleEmployment.be  This test is not yet approved or cleared by the [place] FDA and has been authorized for detection and/or diagnosis of SARS-CoV-2 by FDA under an Emergency Use Authorization (EUA). This EUA will remain in effect (meaning this test can be used) for the duration of the COVID-19 declaration under Section 564(b)(1) of the Act, 21 U.S.C. section 360bbb-3(b)(1), unless the authorization is terminated or revoked.  Performed at [facility], [address], [place], [place] [address]          Radiology Studies: DG Chest [facility] 1 View  Result Date: 08/06/2021 CLINICAL DATA:  Fever EXAM: PORTABLE CHEST 1 VIEW COMPARISON:  None. FINDINGS: Linear bibasilar opacities could reflect scarring or atelectasis. Heart is normal size. No effusions. No acute bony abnormality. IMPRESSION: Bibasilar scarring or atelectasis.  No active disease. Electronically Signed   By: Rolm Baptise M.D.   On: 08/06/2021 15:26        Scheduled Meds:  Chlorhexidine Gluconate Cloth  6 each Topical Daily   enoxaparin (LOVENOX) injection  40 mg Subcutaneous Q24H   famotidine  20 mg Oral QPM   rosuvastatin  5 mg Oral QPM   Continuous Infusions:  cefTRIAXone (ROCEPHIN)  IV       LOS: 1 day    Time spent: 35 mins.More than 50% of that time was spent in counseling and/or coordination of care.      Shelly Coss, MD [facility] P11/14/2022, 11:32 AM

## 2021-08-07 NOTE — Consult Note (Signed)
Urology Consult   Physician requesting consult: Dr. Tawanna Solo  Reason for consult: Fever s/p robotic prostatectomy  History of Present Illness: Aaron Roberts is a 50 y.o. 1 week s/p robotic radical prostatectomy and BPLND for prostate cancer.  He was recovering well until Saturday when he had one episode of chills and fever to 101 which improved without treatment.  He again developed chills and fever to 102 on Sunday and presented to the ED for further evaluation.  No nausea, vomiting.  Having bowel movements.  No cough or SOB.   Past Medical History:  Diagnosis Date   Allergy    seasonal   GERD (gastroesophageal reflux disease)    Hyperlipidemia    Hypertension     Past Surgical History:  Procedure Laterality Date   ROBOT ASSISTED LAPAROSCOPIC RADICAL PROSTATECTOMY N/A 07/31/2021   Procedure: XI ROBOTIC ASSISTED LAPAROSCOPIC RADICAL PROSTATECTOMY LEVEL 2;  Surgeon: Raynelle Bring, MD;  Location: [facility];  Service: Urology;  Laterality: N/A;   VASECTOMY     WISDOM TOOTH EXTRACTION      Medications:  Home meds:  No current facility-administered medications on file prior to encounter.   Current Outpatient Medications on File Prior to Encounter  Medication Sig Dispense Refill   acetaminophen (TYLENOL) 500 MG tablet Take 1,000 mg by mouth every 6 (six) hours as needed (pain).     docusate sodium (COLACE) 100 MG capsule Take 1 capsule (100 mg total) by mouth 2 (two) times daily. (Patient taking differently: Take 100 mg by mouth 2 (two) times daily as needed (constipation).)     famotidine (PEPCID) 20 MG tablet Take 20 mg by mouth every evening.     lisinopril (ZESTRIL) 20 MG tablet Take 20 mg by mouth every evening.     rosuvastatin (CRESTOR) 5 MG tablet Take 5 mg by mouth every evening.     sulfamethoxazole-trimethoprim (BACTRIM DS) 800-160 MG tablet Take 1 tablet by mouth 2 (two) times daily. Start the day prior to foley removal appointment (Patient not taking: Reported on  08/06/2021) 6 tablet 0   traMADol (ULTRAM) 50 MG tablet Take 1-2 tablets (50-100 mg total) by mouth every 6 (six) hours as needed for moderate pain or severe pain. (Patient not taking: Reported on 08/06/2021) 20 tablet 0     Scheduled Meds:  enoxaparin (LOVENOX) injection  40 mg Subcutaneous Q24H   famotidine  20 mg Oral QPM   rosuvastatin  5 mg Oral QPM   Continuous Infusions:  sodium chloride 125 mL/hr at 08/07/21 0428   cefTRIAXone (ROCEPHIN)  IV     PRN Meds:.acetaminophen **OR** acetaminophen  Allergies: No Known Allergies  Family History  Problem Relation Age of Onset   Colon cancer Neg Hx    Esophageal cancer Neg Hx    Rectal cancer Neg Hx    Stomach cancer Neg Hx     Social History:  reports that he has quit smoking. His smokeless tobacco use includes snuff. He reports current alcohol use. He reports that he does not use drugs.  ROS: A complete review of systems was performed.  All systems are negative except for pertinent findings as noted.  Physical Exam:  Vital signs in last 24 hours: Temp:  [98 F (36.7 C)-99.2 F (37.3 C)] 98.6 F (37 C) (11/14 0507) Pulse Rate:  [84-152] 87 (11/14 0507) Resp:  [16-20] 17 (11/14 0119) BP: (127-156)/(94-108) 151/97 (11/14 0507) SpO2:  [93 %-100 %] 94 % (11/14 0507) Weight:  [75 kg] 75 kg (11/13 1735) Constitutional:  Alert and oriented, No acute distress Cardiovascular: Regular rate and rhythm, No JVD Respiratory: Normal respiratory effort, Lungs clear bilaterally GI: Abdomen is soft, nontender, nondistended, no abdominal masses, incisions C/D/I Genitourinary: No CVAT. Indwelling catheter with grossly clear urine Lymphatic: No lymphadenopathy Neurologic: Grossly intact, no focal deficits Psychiatric: Normal mood and affect  Laboratory Data:  Recent Labs    08/06/21 1202 08/07/21 0521  WBC 4.2 4.9  HGB 14.9 12.5*  HCT 45.4 38.3*  PLT 271 234    Recent Labs    08/06/21 1202  NA 135  K 3.9  CL 97*  GLUCOSE  146*  BUN 17  CALCIUM 9.6  CREATININE 1.29*     Results for orders placed or performed during the hospital encounter of 08/06/21 (from the past 24 hour(s))  Culture, blood (Routine x 2)     Status: None (Preliminary result)   Collection Time: 08/06/21 12:00 PM   Specimen: BLOOD  Result Value Ref Range   Specimen Description      BLOOD LEFT ANTECUBITAL Performed at [facility], [address], [place], [place] [address]    Special Requests      BOTTLES DRAWN AEROBIC AND ANAEROBIC Blood Culture results may not be optimal due to an excessive volume of blood received in culture bottles Performed at [facility], [place], [place] [address]    Culture  Setup Time      GRAM NEGATIVE RODS ANAEROBIC BOTTLE ONLY CRITICAL RESULT CALLED TO, READ BACK BY AND VERIFIED WITH: M LILLISTON,[email or web address]@[id number]  08/07/21 [place] Performed at [facility], [address], [place], [place] [address]    Culture PENDING    Report Status PENDING   Blood Culture ID Panel (Reflexed)     Status: Abnormal   Collection Time: 08/06/21 12:00 PM  Result Value Ref Range   Enterococcus faecalis NOT DETECTED NOT DETECTED   Enterococcus Faecium NOT DETECTED NOT DETECTED   Listeria monocytogenes NOT DETECTED NOT DETECTED   Staphylococcus species NOT DETECTED NOT DETECTED   Staphylococcus aureus (BCID) NOT DETECTED NOT DETECTED   Staphylococcus epidermidis NOT DETECTED NOT DETECTED   Staphylococcus lugdunensis NOT DETECTED NOT DETECTED   Streptococcus species NOT DETECTED NOT DETECTED   Streptococcus agalactiae NOT DETECTED NOT DETECTED   Streptococcus pneumoniae NOT DETECTED NOT DETECTED   Streptococcus pyogenes NOT DETECTED NOT DETECTED   A.calcoaceticus-baumannii NOT DETECTED NOT DETECTED   Bacteroides fragilis NOT DETECTED NOT DETECTED   Enterobacterales DETECTED (A) NOT DETECTED   Enterobacter cloacae complex NOT DETECTED NOT DETECTED   Escherichia coli  DETECTED (A) NOT DETECTED   Klebsiella aerogenes NOT DETECTED NOT DETECTED   Klebsiella oxytoca NOT DETECTED NOT DETECTED   Klebsiella pneumoniae NOT DETECTED NOT DETECTED   Proteus species NOT DETECTED NOT DETECTED   Salmonella species NOT DETECTED NOT DETECTED   Serratia marcescens NOT DETECTED NOT DETECTED   Haemophilus influenzae NOT DETECTED NOT DETECTED   Neisseria meningitidis NOT DETECTED NOT DETECTED   Pseudomonas aeruginosa NOT DETECTED NOT DETECTED   Stenotrophomonas maltophilia NOT DETECTED NOT DETECTED   Candida albicans NOT DETECTED NOT DETECTED   Candida auris NOT DETECTED NOT DETECTED   Candida glabrata NOT DETECTED NOT DETECTED   Candida krusei NOT DETECTED NOT DETECTED   Candida parapsilosis NOT DETECTED NOT DETECTED   Candida tropicalis NOT DETECTED NOT DETECTED   Cryptococcus neoformans/gattii NOT DETECTED NOT DETECTED   CTX-M ESBL NOT DETECTED NOT DETECTED   Carbapenem resistance IMP NOT DETECTED NOT DETECTED  Carbapenem resistance KPC NOT DETECTED NOT DETECTED   Carbapenem resistance NDM NOT DETECTED NOT DETECTED   Carbapenem resist OXA 48 LIKE NOT DETECTED NOT DETECTED   Carbapenem resistance VIM NOT DETECTED NOT DETECTED  Comprehensive metabolic panel     Status: Abnormal   Collection Time: 08/06/21 12:02 PM  Result Value Ref Range   Sodium 135 135 - 145 mmol/L   Potassium 3.9 3.5 - 5.1 mmol/L   Chloride 97 (L) 98 - 111 mmol/L   CO2 27 22 - 32 mmol/L   Glucose, Bld 146 (H) 70 - 99 mg/dL   BUN 17 6 - 20 mg/dL   Creatinine, Ser 1.29 (H) 0.61 - 1.24 mg/dL   Calcium 9.6 8.9 - 10.3 mg/dL   Total Protein 7.9 6.5 - 8.1 g/dL   Albumin 4.2 3.5 - 5.0 g/dL   AST 46 (H) 15 - 41 U/L   ALT 43 0 - 44 U/L   Alkaline Phosphatase 92 38 - 126 U/L   Total Bilirubin 0.9 0.3 - 1.2 mg/dL   GFR, Estimated >60 >60 mL/min   Anion gap 11 5 - 15  Lactic acid, plasma     Status: Abnormal   Collection Time: 08/06/21 12:02 PM  Result Value Ref Range   Lactic Acid, Venous 2.5  (HH) 0.5 - 1.9 mmol/L  CBC with Differential     Status: Abnormal   Collection Time: 08/06/21 12:02 PM  Result Value Ref Range   WBC 4.2 4.0 - 10.5 K/uL   RBC 5.01 4.22 - 5.81 MIL/uL   Hemoglobin 14.9 13.0 - 17.0 g/dL   HCT 45.4 39.0 - 52.0 %   MCV 90.6 80.0 - 100.0 fL   MCH 29.7 26.0 - 34.0 pg   MCHC 32.8 30.0 - 36.0 g/dL   RDW 12.6 11.5 - 15.5 %   Platelets 271 150 - 400 K/uL   nRBC 0.0 0.0 - 0.2 %   Neutrophils Relative % 93 %   Neutro Abs 3.9 1.7 - 7.7 K/uL   Lymphocytes Relative 2 %   Lymphs Abs 0.1 (L) 0.7 - 4.0 K/uL   Monocytes Relative 2 %   Monocytes Absolute 0.1 0.1 - 1.0 K/uL   Eosinophils Relative 0 %   Eosinophils Absolute 0.0 0.0 - 0.5 K/uL   Basophils Relative 1 %   Basophils Absolute 0.0 0.0 - 0.1 K/uL   Immature Granulocytes 2 %   Abs Immature Granulocytes 0.07 0.00 - 0.07 K/uL  Protime-INR     Status: Abnormal   Collection Time: 08/06/21 12:02 PM  Result Value Ref Range   Prothrombin Time 15.5 (H) 11.4 - 15.2 seconds   INR 1.2 0.8 - 1.2  Urinalysis, Routine w reflex microscopic Urine, Clean Catch     Status: Abnormal   Collection Time: 08/06/21 12:02 PM  Result Value Ref Range   Color, Urine YELLOW YELLOW   APPearance HAZY (A) CLEAR   Specific Gravity, Urine 1.026 1.005 - 1.030   pH 6.0 5.0 - 8.0   Glucose, UA NEGATIVE NEGATIVE mg/dL   Hgb urine dipstick LARGE (A) NEGATIVE   Bilirubin Urine NEGATIVE NEGATIVE   Ketones, ur NEGATIVE NEGATIVE mg/dL   Protein, ur 100 (A) NEGATIVE mg/dL   Nitrite POSITIVE (A) NEGATIVE   Leukocytes,Ua LARGE (A) NEGATIVE   RBC / HPF >50 (H) 0 - 5 RBC/hpf   WBC, UA >50 (H) 0 - 5 WBC/hpf   Bacteria, UA MANY (A) NONE SEEN   Squamous Epithelial / LPF 0-5 0 -  5   Mucus PRESENT   Urinalysis, Routine w reflex microscopic Urine, Catheterized     Status: Abnormal   Collection Time: 08/06/21 12:34 PM  Result Value Ref Range   Color, Urine YELLOW YELLOW   APPearance HAZY (A) CLEAR   Specific Gravity, Urine 1.012 1.005 - 1.030    pH 5.5 5.0 - 8.0   Glucose, UA NEGATIVE NEGATIVE mg/dL   Hgb urine dipstick LARGE (A) NEGATIVE   Bilirubin Urine NEGATIVE NEGATIVE   Ketones, ur NEGATIVE NEGATIVE mg/dL   Protein, ur TRACE (A) NEGATIVE mg/dL   Nitrite POSITIVE (A) NEGATIVE   Leukocytes,Ua LARGE (A) NEGATIVE   RBC / HPF 11-20 0 - 5 RBC/hpf   WBC, UA 11-20 0 - 5 WBC/hpf   Bacteria, UA MANY (A) NONE SEEN   Mucus PRESENT   Resp Panel by RT-PCR (Flu A&B, Covid) Nasopharyngeal Swab     Status: None   Collection Time: 08/06/21  1:34 PM   Specimen: Nasopharyngeal Swab; Nasopharyngeal(NP) swabs in vial transport medium  Result Value Ref Range   SARS Coronavirus 2 by RT PCR NEGATIVE NEGATIVE   Influenza A by PCR NEGATIVE NEGATIVE   Influenza B by PCR NEGATIVE NEGATIVE  Lactic acid, plasma     Status: None   Collection Time: 08/06/21  2:25 PM  Result Value Ref Range   Lactic Acid, Venous 1.1 0.5 - 1.9 mmol/L  CBC     Status: Abnormal   Collection Time: 08/07/21  5:21 AM  Result Value Ref Range   WBC 4.9 4.0 - 10.5 K/uL   RBC 4.21 (L) 4.22 - 5.81 MIL/uL   Hemoglobin 12.5 (L) 13.0 - 17.0 g/dL   HCT 38.3 (L) 39.0 - 52.0 %   MCV 91.0 80.0 - 100.0 fL   MCH 29.7 26.0 - 34.0 pg   MCHC 32.6 30.0 - 36.0 g/dL   RDW 12.7 11.5 - 15.5 %   Platelets 234 150 - 400 K/uL   nRBC 0.0 0.0 - 0.2 %   Recent Results (from the past 240 hour(s))  Culture, blood (Routine x 2)     Status: None (Preliminary result)   Collection Time: 08/06/21 12:00 PM   Specimen: BLOOD  Result Value Ref Range Status   Specimen Description   Final    BLOOD LEFT ANTECUBITAL Performed at [facility], [address] [place], [place] [address]    Special Requests   Final    BOTTLES DRAWN AEROBIC AND ANAEROBIC Blood Culture results may not be optimal due to an excessive volume of blood received in culture bottles Performed at [facility], [address], [place], [place] [address]    Culture  Setup Time   Final     GRAM NEGATIVE RODS ANAEROBIC BOTTLE ONLY CRITICAL RESULT CALLED TO, READ BACK BY AND VERIFIED WITH: M LILLISTON,[email or web address]@[email or web address]  08/07/21 [place] Performed at [facility], [address], [place], [place] [address]    Culture PENDING  Incomplete   Report Status PENDING  Incomplete  Blood Culture ID Panel (Reflexed)     Status: Abnormal   Collection Time: 08/06/21 12:00 PM  Result Value Ref Range Status   Enterococcus faecalis NOT DETECTED NOT DETECTED Final   Enterococcus Faecium NOT DETECTED NOT DETECTED Final   Listeria monocytogenes NOT DETECTED NOT DETECTED Final   Staphylococcus species NOT DETECTED NOT DETECTED Final   Staphylococcus aureus (BCID) NOT DETECTED NOT DETECTED Final   Staphylococcus epidermidis NOT DETECTED NOT DETECTED Final   Staphylococcus lugdunensis  NOT DETECTED NOT DETECTED Final   Streptococcus species NOT DETECTED NOT DETECTED Final   Streptococcus agalactiae NOT DETECTED NOT DETECTED Final   Streptococcus pneumoniae NOT DETECTED NOT DETECTED Final   Streptococcus pyogenes NOT DETECTED NOT DETECTED Final   A.calcoaceticus-baumannii NOT DETECTED NOT DETECTED Final   Bacteroides fragilis NOT DETECTED NOT DETECTED Final   Enterobacterales DETECTED (A) NOT DETECTED Final    Comment: Enterobacterales represent a large order of gram negative bacteria, not a single organism. CRITICAL RESULT CALLED TO, READ BACK BY AND VERIFIED WITH: M LILLISTON,PHARMD@[email or web address]  08/07/21 [place]    Enterobacter cloacae complex NOT DETECTED NOT DETECTED Final   Escherichia coli DETECTED (A) NOT DETECTED Final    Comment: CRITICAL RESULT CALLED TO, READ BACK BY AND VERIFIED WITH: M LILLISTON,PHARMD@[email or web address]  08/07/21 [place]    Klebsiella aerogenes NOT DETECTED NOT DETECTED Final   Klebsiella oxytoca NOT DETECTED NOT DETECTED Final   Klebsiella pneumoniae NOT DETECTED NOT DETECTED Final   Proteus species NOT DETECTED NOT DETECTED Final   Salmonella species NOT DETECTED NOT DETECTED Final   Serratia  marcescens NOT DETECTED NOT DETECTED Final   Haemophilus influenzae NOT DETECTED NOT DETECTED Final   Neisseria meningitidis NOT DETECTED NOT DETECTED Final   Pseudomonas aeruginosa NOT DETECTED NOT DETECTED Final   Stenotrophomonas maltophilia NOT DETECTED NOT DETECTED Final   Candida albicans NOT DETECTED NOT DETECTED Final   Candida auris NOT DETECTED NOT DETECTED Final   Candida glabrata NOT DETECTED NOT DETECTED Final   Candida krusei NOT DETECTED NOT DETECTED Final   Candida parapsilosis NOT DETECTED NOT DETECTED Final   Candida tropicalis NOT DETECTED NOT DETECTED Final   Cryptococcus neoformans/gattii NOT DETECTED NOT DETECTED Final   CTX-M ESBL NOT DETECTED NOT DETECTED Final   Carbapenem resistance IMP NOT DETECTED NOT DETECTED Final   Carbapenem resistance KPC NOT DETECTED NOT DETECTED Final   Carbapenem resistance NDM NOT DETECTED NOT DETECTED Final   Carbapenem resist OXA 48 LIKE NOT DETECTED NOT DETECTED Final   Carbapenem resistance VIM NOT DETECTED NOT DETECTED Final    Comment: Performed at [facility], [address] [place], [place] [address]  Resp Panel by RT-PCR (Flu A&B, Covid) Nasopharyngeal Swab     Status: None   Collection Time: 08/06/21  1:34 PM   Specimen: Nasopharyngeal Swab; Nasopharyngeal(NP) swabs in vial transport medium  Result Value Ref Range Status   SARS Coronavirus 2 by RT PCR NEGATIVE NEGATIVE Final    Comment: (NOTE) SARS-CoV-2 target nucleic acids are NOT DETECTED.  The SARS-CoV-2 RNA is generally detectable in upper respiratory specimens during the acute phase of infection. The lowest concentration of SARS-CoV-2 viral copies this assay can detect is 138 copies/mL. A negative result does not preclude SARS-Cov-2 infection and should not be used as the sole basis for treatment or other patient management decisions. A negative result may occur with  improper specimen collection/handling, submission of specimen other than nasopharyngeal  swab, presence of viral mutation(s) within the areas targeted by this assay, and inadequate number of viral copies(<138 copies/mL). A negative result must be combined with clinical observations, patient history, and epidemiological information. The expected result is Negative.  Fact Sheet for Patients:  EntrepreneurPulse.com.au  Fact Sheet for Healthcare Providers:  IncredibleEmployment.be  This test is no t yet approved or cleared by the [place] FDA and  has been authorized for detection and/or diagnosis of SARS-CoV-2 by FDA under an Emergency Use Authorization (EUA). This EUA will remain  in effect (meaning this  test can be used) for the duration of the COVID-19 declaration under Section 564(b)(1) of the Act, 21 U.S.C.section 360bbb-3(b)(1), unless the authorization is terminated  or revoked sooner.       Influenza A by PCR NEGATIVE NEGATIVE Final   Influenza B by PCR NEGATIVE NEGATIVE Final    Comment: (NOTE) The Xpert Xpress SARS-CoV-2/FLU/RSV plus assay is intended as an aid in the diagnosis of influenza from Nasopharyngeal swab specimens and should not be used as a sole basis for treatment. Nasal washings and aspirates are unacceptable for Xpert Xpress SARS-CoV-2/FLU/RSV testing.  Fact Sheet for Patients: EntrepreneurPulse.com.au  Fact Sheet for Healthcare Providers: IncredibleEmployment.be  This test is not yet approved or cleared by the [place] FDA and has been authorized for detection and/or diagnosis of SARS-CoV-2 by FDA under an Emergency Use Authorization (EUA). This EUA will remain in effect (meaning this test can be used) for the duration of the COVID-19 declaration under Section 564(b)(1) of the Act, 21 U.S.C. section 360bbb-3(b)(1), unless the authorization is terminated or revoked.  Performed at [facility], [address], [place], [place] [address]      Renal Function: Recent Labs    08/06/21 1202  CREATININE 1.29*   Estimated Creatinine Clearance: 64.8 mL/min (A) (by C-G formula based on SCr of 1.29 mg/dL (H)).  Radiologic Imaging: DG Chest Port 1 View  Result Date: 08/06/2021 CLINICAL DATA:  Fever EXAM: PORTABLE CHEST 1 VIEW COMPARISON:  None. FINDINGS: Linear bibasilar opacities could reflect scarring or atelectasis. Heart is normal size. No effusions. No acute bony abnormality. IMPRESSION: Bibasilar scarring or atelectasis.  No active disease. Electronically Signed   By: Rolm Baptise M.D.   On: 08/06/2021 15:26    I independently reviewed the above imaging studies.  Impression/Recommendation 1) Bacteremia s/p radical prostatectomy:  Blood cultures with GNRs consistent with GU source.  Clinically improving on ceftriaxone.  Will need to continue treatment course pending cultures for bacteremia.  Do not suspect other cause for fever.  Will plan to proceed with catheter removal tomorrow as was scheduled.   2) Prostate cancer:  I reviewed his pathology report with him indicating an organ confined, Gleason 4+3=7 adenocarcinoma with negative surgical margins.  His prognosis is good.  Dutch Gray 08/07/2021, 7:26 AM    Pryor Curia MD  CC: Dr. Tawanna Solo

## 2021-08-07 NOTE — Progress Notes (Signed)
PHARMACY - PHYSICIAN COMMUNICATION CRITICAL VALUE ALERT - BLOOD CULTURE IDENTIFICATION (BCID)  Aaron Roberts is an 50 y.o. male who presented to [facility] on 08/06/2021 with a chief complaint of fever.  Assessment: s/p prostatecomy ~ 1 week ago.  Discharged with foley catheter.  Admit for urosepsis.  BCID+ Ecoli, no resistance.   Name of physician (or Provider) Contacted: Hal Hope  Current antibiotics: Rocephin 2gm IV  Changes to prescribed antibiotics recommended:  Patient is on recommended antibiotics - No changes needed  Results for orders placed or performed during the hospital encounter of 08/06/21  Blood Culture ID Panel (Reflexed) (Collected: 08/06/2021 12:00 PM)  Result Value Ref Range   Enterococcus faecalis NOT DETECTED NOT DETECTED   Enterococcus Faecium NOT DETECTED NOT DETECTED   Listeria monocytogenes NOT DETECTED NOT DETECTED   Staphylococcus species NOT DETECTED NOT DETECTED   Staphylococcus aureus (BCID) NOT DETECTED NOT DETECTED   Staphylococcus epidermidis NOT DETECTED NOT DETECTED   Staphylococcus lugdunensis NOT DETECTED NOT DETECTED   Streptococcus species NOT DETECTED NOT DETECTED   Streptococcus agalactiae NOT DETECTED NOT DETECTED   Streptococcus pneumoniae NOT DETECTED NOT DETECTED   Streptococcus pyogenes NOT DETECTED NOT DETECTED   A.calcoaceticus-baumannii NOT DETECTED NOT DETECTED   Bacteroides fragilis NOT DETECTED NOT DETECTED   Enterobacterales DETECTED (A) NOT DETECTED   Enterobacter cloacae complex NOT DETECTED NOT DETECTED   Escherichia coli DETECTED (A) NOT DETECTED   Klebsiella aerogenes NOT DETECTED NOT DETECTED   Klebsiella oxytoca NOT DETECTED NOT DETECTED   Klebsiella pneumoniae NOT DETECTED NOT DETECTED   Proteus species NOT DETECTED NOT DETECTED   Salmonella species NOT DETECTED NOT DETECTED   Serratia marcescens NOT DETECTED NOT DETECTED   Haemophilus influenzae NOT DETECTED NOT DETECTED   Neisseria meningitidis NOT DETECTED NOT  DETECTED   Pseudomonas aeruginosa NOT DETECTED NOT DETECTED   Stenotrophomonas maltophilia NOT DETECTED NOT DETECTED   Candida albicans NOT DETECTED NOT DETECTED   Candida auris NOT DETECTED NOT DETECTED   Candida glabrata NOT DETECTED NOT DETECTED   Candida krusei NOT DETECTED NOT DETECTED   Candida parapsilosis NOT DETECTED NOT DETECTED   Candida tropicalis NOT DETECTED NOT DETECTED   Cryptococcus neoformans/gattii NOT DETECTED NOT DETECTED   CTX-M ESBL NOT DETECTED NOT DETECTED   Carbapenem resistance IMP NOT DETECTED NOT DETECTED   Carbapenem resistance KPC NOT DETECTED NOT DETECTED   Carbapenem resistance NDM NOT DETECTED NOT DETECTED   Carbapenem resist OXA 48 LIKE NOT DETECTED NOT DETECTED   Carbapenem resistance VIM NOT DETECTED NOT DETECTED    Netta Cedars PharmD 08/07/2021  6:11 AM

## 2021-08-08 DIAGNOSIS — N390 Urinary tract infection, site not specified: Secondary | ICD-10-CM | POA: Diagnosis not present

## 2021-08-08 LAB — CBC WITH DIFFERENTIAL/PLATELET
Abs Immature Granulocytes: 0.08 10*3/uL — ABNORMAL HIGH (ref 0.00–0.07)
Basophils Absolute: 0.1 10*3/uL (ref 0.0–0.1)
Basophils Relative: 1 %
Eosinophils Absolute: 0.1 10*3/uL (ref 0.0–0.5)
Eosinophils Relative: 2 %
HCT: 37.9 % — ABNORMAL LOW (ref 39.0–52.0)
Hemoglobin: 12.8 g/dL — ABNORMAL LOW (ref 13.0–17.0)
Immature Granulocytes: 2 %
Lymphocytes Relative: 25 %
Lymphs Abs: 1.2 10*3/uL (ref 0.7–4.0)
MCH: 30.4 pg (ref 26.0–34.0)
MCHC: 33.8 g/dL (ref 30.0–36.0)
MCV: 90 fL (ref 80.0–100.0)
Monocytes Absolute: 1 10*3/uL (ref 0.1–1.0)
Monocytes Relative: 21 %
Neutro Abs: 2.3 10*3/uL (ref 1.7–7.7)
Neutrophils Relative %: 49 %
Platelets: 257 10*3/uL (ref 150–400)
RBC: 4.21 MIL/uL — ABNORMAL LOW (ref 4.22–5.81)
RDW: 12.7 % (ref 11.5–15.5)
WBC: 4.7 10*3/uL (ref 4.0–10.5)
nRBC: 0 % (ref 0.0–0.2)

## 2021-08-08 LAB — URINE CULTURE: Culture: >=100000 — AB

## 2021-08-08 LAB — BASIC METABOLIC PANEL WITH GFR
Anion gap: 8 (ref 5–15)
BUN: 10 mg/dL (ref 6–20)
CO2: 24 mmol/L (ref 22–32)
Calcium: 8.7 mg/dL — ABNORMAL LOW (ref 8.9–10.3)
Chloride: 109 mmol/L (ref 98–111)
Creatinine, Ser: 0.94 mg/dL (ref 0.61–1.24)
GFR, Estimated: >60 mL/min
Glucose, Bld: 120 mg/dL — ABNORMAL HIGH (ref 70–99)
Potassium: 3.8 mmol/L (ref 3.5–5.1)
Sodium: 141 mmol/L (ref 135–145)

## 2021-08-08 NOTE — Progress Notes (Signed)
PROGRESS NOTE    Aaron Roberts  MVH:[id number] DOB: Jun 06, 1971 DOA: 08/06/2021 PCP: Beola Cord, FNP   Chief Complain: Fever and chills  Brief Narrative: Patient is a 50 year old male with history of prostatic adenocarcinoma status post prostatectomy on 07/31/2021 and was discharged home with Foley catheter, hypertension, hyperlipidemia, GERD who presented to the emergency part with complaints of fever and chills.  He was tachycardic on presentation.  Lactic acid is elevated at 2.5.  UA was positive with nitrate, large amount of leukocytes, red blood cells, WBC more than 50, many bacteria.  Patient was admitted for the management of possible sepsis secondary to UTI.  Blood culture, urine culture sent.  Currently on ceftriaxone.  Urology following.  Assessment & Plan:   Principal Problem:   Complicated UTI (urinary tract infection) Active Problems:   Prostate cancer ([place])   Sepsis ([place])   Essential hypertension   Hyperlipidemia   Sepsis secondary to complicated UTI/bacteremia: Recent history of prostatectomy for prostatic adenocarcinoma.  Was recently discharged home with Foley catheter.  Presented with fever, chills, tachycardia, lactic acidosis. Continue IV fluids, currently on ceftriaxone.    Sepsis physiology has improved. Blood cultures/Urine culture  now showing gram-negative rods/E. coli.  Urine culture showed pansensitive E. coli.  Awaiting blood culture sensitivity.  Prostatic adenocarcinoma: Status post prostatectomy on 07/31/2021.  Urology consulted and following.  Hypertension:: On presentation, antihypertensives not started due to suspicion for sepsis.  Currently he is hypertensive.  We will restart his home medications  Hyperlipidemia: On Crestor  GERD: On Pepcid  Diarrhea: Denies any abdominal pain, nausea or vomiting.  He says he develops diarrhea sometimes when he gets IV fluids.  Low suspicion for infectious etiology.  Will not send Cdiff or  GI pathogen panel.   Diarrhea has resolved.           DVT prophylaxis:Lovenox Code Status: Full Family Communication: Discussed with wife at bedside Status is: Inpatient  Remains inpatient appropriate because: Currently on IV antibiotics for sepsis/bacteremia      Consultants: Urology  Procedures: None  Antimicrobials:  Anti-infectives (From admission, onward)    Start     Dose/Rate Route Frequency Ordered Stop   08/07/21 1200  cefTRIAXone (ROCEPHIN) 2 g in sodium chloride 0.9 % 100 mL IVPB        2 g 200 mL/hr over 30 Minutes Intravenous Every 24 hours 08/06/21 1942     08/06/21 1245  cefTRIAXone (ROCEPHIN) 2 g in sodium chloride 0.9 % 100 mL IVPB        2 g 200 mL/hr over 30 Minutes Intravenous  Once 08/06/21 1233 08/06/21 1726       Subjective:  Patient seen and examined the bedside this morning.  Comfortable without any complaints.  Eager to go home .we discussed about importance of following on Blood culture sensitivity report  Objective: Vitals:   08/07/21 0507 08/07/21 1447 08/07/21 2135 08/08/21 0504  BP: (!) 151/97 (!) 157/107 135/84 (!) 120/95  Pulse: 87 97 91 85  Resp:  18 16 16   Temp: 98.6 F (37 C) 98.8 F (37.1 C) 98.1 F (36.7 C) (!) 97.3 F (36.3 C)  TempSrc: Oral Oral Oral Oral  SpO2: 94% 98% 97% 100%  Weight:      Height:        Intake/Output Summary (Last 24 hours) at 08/08/2021 0816 Last data filed at 08/08/2021 0510 Gross per 24 hour  Intake 575.65 ml  Output 3325 ml  Net -2749.35 ml   [employer]  08/06/21 1735  Weight: 75 kg    Examination:  General exam: Overall comfortable, not in distress HEENT: PERRL Respiratory system:  no wheezes or crackles  Cardiovascular system: S1 & S2 heard, RRR.  Gastrointestinal system: Abdomen is nondistended, soft and nontender. Central nervous system: Alert and oriented Extremities: No edema, no clubbing ,no cyanosis Skin: No rashes, no ulcers,no icterus   GU: Foley   Data Reviewed: I have  personally reviewed following labs and imaging studies  CBC: Recent Labs  Lab 08/01/21 1105 08/06/21 1202 08/07/21 0521 08/08/21 0515  WBC  --  4.2 4.9 4.7  NEUTROABS  --  3.9  --  2.3  HGB 12.0* 14.9 12.5* 12.8*  HCT 36.9* 45.4 38.3* 37.9*  MCV  --  90.6 91.0 90.0  PLT  --  271 234 353   Basic Metabolic Panel: Recent Labs  Lab 08/06/21 1202 08/08/21 0515  NA 135 141  K 3.9 3.8  CL 97* 109  CO2 27 24  GLUCOSE 146* 120*  BUN 17 10  CREATININE 1.29* 0.94  CALCIUM 9.6 8.7*   GFR: Estimated Creatinine Clearance: 88.9 mL/min (by C-G formula based on SCr of 0.94 mg/dL). Liver Function Tests: Recent Labs  Lab 08/06/21 1202  AST 46*  ALT 43  ALKPHOS 92  BILITOT 0.9  PROT 7.9  ALBUMIN 4.2   No results for input(s): LIPASE, AMYLASE in the last 168 hours. No results for input(s): AMMONIA in the last 168 hours. Coagulation Profile: Recent Labs  Lab 08/06/21 1202  INR 1.2   Cardiac Enzymes: No results for input(s): CKTOTAL, CKMB, CKMBINDEX, TROPONINI in the last 168 hours. BNP (last 3 results) No results for input(s): PROBNP in the last 8760 hours. HbA1C: No results for input(s): HGBA1C in the last 72 hours. CBG: No results for input(s): GLUCAP in the last 168 hours. Lipid Profile: No results for input(s): CHOL, HDL, LDLCALC, TRIG, CHOLHDL, LDLDIRECT in the last 72 hours. Thyroid Function Tests: No results for input(s): TSH, T4TOTAL, FREET4, T3FREE, THYROIDAB in the last 72 hours. Anemia Panel: No results for input(s): VITAMINB12, FOLATE, FERRITIN, TIBC, IRON, RETICCTPCT in the last 72 hours. Sepsis Labs: Recent Labs  Lab 08/06/21 1202 08/06/21 1425  LATICACIDVEN 2.5* 1.1    Recent Results (from the past 240 hour(s))  Culture, blood (Routine x 2)     Status: None (Preliminary result)   Collection Time: 08/06/21 12:00 PM   Specimen: BLOOD  Result Value Ref Range Status   Specimen Description   Final    BLOOD LEFT ANTECUBITAL Performed at [facility], [address], [place], [place] [address]    Special Requests   Final    BOTTLES DRAWN AEROBIC AND ANAEROBIC Blood Culture results may not be optimal due to an excessive volume of blood received in culture bottles Performed at [facility], [place], [place] [address]    Culture  Setup Time   Final    GRAM NEGATIVE RODS ANAEROBIC BOTTLE ONLY CRITICAL RESULT CALLED TO, READ BACK BY AND VERIFIED WITH: M LILLISTON,PHARMD@[id number]  08/07/21 [place] Performed at [facility], [place] [address] [place],  [address]    Culture GRAM NEGATIVE RODS  Final   Report Status PENDING  Incomplete  Blood Culture ID Panel (Reflexed)     Status: Abnormal   Collection Time: 08/06/21 12:00 PM  Result Value Ref Range Status   Enterococcus faecalis NOT DETECTED NOT DETECTED Final   Enterococcus Faecium NOT DETECTED NOT DETECTED Final  Listeria monocytogenes NOT DETECTED NOT DETECTED Final   Staphylococcus species NOT DETECTED NOT DETECTED Final   Staphylococcus aureus (BCID) NOT DETECTED NOT DETECTED Final   Staphylococcus epidermidis NOT DETECTED NOT DETECTED Final   Staphylococcus lugdunensis NOT DETECTED NOT DETECTED Final   Streptococcus species NOT DETECTED NOT DETECTED Final   Streptococcus agalactiae NOT DETECTED NOT DETECTED Final   Streptococcus pneumoniae NOT DETECTED NOT DETECTED Final   Streptococcus pyogenes NOT DETECTED NOT DETECTED Final   A.calcoaceticus-baumannii NOT DETECTED NOT DETECTED Final   Bacteroides fragilis NOT DETECTED NOT DETECTED Final   Enterobacterales DETECTED (A) NOT DETECTED Final    Comment: Enterobacterales represent a large order of gram negative bacteria, not a single organism. CRITICAL RESULT CALLED TO, READ BACK BY AND VERIFIED WITH: M LILLISTON,PHARMD@[id number]  08/07/21 [place]    Enterobacter cloacae complex NOT DETECTED NOT DETECTED Final   Escherichia coli DETECTED (A) NOT DETECTED Final    Comment:  CRITICAL RESULT CALLED TO, READ BACK BY AND VERIFIED WITH: M LILLISTON,PHARMD@[id number]  08/07/21 [place]    Klebsiella aerogenes NOT DETECTED NOT DETECTED Final   Klebsiella oxytoca NOT DETECTED NOT DETECTED Final   Klebsiella pneumoniae NOT DETECTED NOT DETECTED Final   Proteus species NOT DETECTED NOT DETECTED Final   Salmonella species NOT DETECTED NOT DETECTED Final   Serratia marcescens NOT DETECTED NOT DETECTED Final   Haemophilus influenzae NOT DETECTED NOT DETECTED Final   Neisseria meningitidis NOT DETECTED NOT DETECTED Final   Pseudomonas aeruginosa NOT DETECTED NOT DETECTED Final   Stenotrophomonas maltophilia NOT DETECTED NOT DETECTED Final   Candida albicans NOT DETECTED NOT DETECTED Final   Candida auris NOT DETECTED NOT DETECTED Final   Candida glabrata NOT DETECTED NOT DETECTED Final   Candida krusei NOT DETECTED NOT DETECTED Final   Candida parapsilosis NOT DETECTED NOT DETECTED Final   Candida tropicalis NOT DETECTED NOT DETECTED Final   Cryptococcus neoformans/gattii NOT DETECTED NOT DETECTED Final   CTX-M ESBL NOT DETECTED NOT DETECTED Final   Carbapenem resistance IMP NOT DETECTED NOT DETECTED Final   Carbapenem resistance KPC NOT DETECTED NOT DETECTED Final   Carbapenem resistance NDM NOT DETECTED NOT DETECTED Final   Carbapenem resist OXA 48 LIKE NOT DETECTED NOT DETECTED Final   Carbapenem resistance VIM NOT DETECTED NOT DETECTED Final    Comment: Performed at [facility], [address], [place], [place] [address]  Culture, blood (Routine x 2)     Status: None (Preliminary result)   Collection Time: 08/06/21 12:20 PM   Specimen: BLOOD  Result Value Ref Range Status   Specimen Description   Final    BLOOD RIGHT ANTECUBITAL Performed at [facility], [address] [place], [place] [address]    Special Requests   Final    BOTTLES DRAWN AEROBIC AND ANAEROBIC Blood Culture adequate volume Performed at [facility], [address], [place], [place] [address]    Culture   Final    NO GROWTH < 24 HOURS Performed at [facility], [place] [address], [place], [place] [address]    Report Status PENDING  Incomplete  Urine Culture     Status: Abnormal (Preliminary result)   Collection Time: 08/06/21 12:34 PM   Specimen: Urine, Catheterized  Result Value Ref Range Status   Specimen Description   Final    URINE, CATHETERIZED Performed at [facility], [address] [place], [place] [address]    Special Requests   Final    NONE Performed at [facility], [place], [place] [address]    Culture (A)  Final    >=100,000 COLONIES/mL ESCHERICHIA COLI SUSCEPTIBILITIES TO FOLLOW Performed at [facility], [place] [address], [place], [place] [address]    Report Status PENDING  Incomplete  Resp Panel by RT-PCR (Flu A&B, Covid) Nasopharyngeal Swab     Status: None   Collection Time: 08/06/21  1:34 PM   Specimen: Nasopharyngeal Swab; Nasopharyngeal(NP) swabs in vial transport medium  Result Value Ref Range Status   SARS Coronavirus 2 by RT PCR NEGATIVE NEGATIVE Final    Comment: (NOTE) SARS-CoV-2 target nucleic acids are NOT DETECTED.  The SARS-CoV-2 RNA is generally detectable in upper respiratory specimens during the acute phase of infection. The lowest concentration of SARS-CoV-2 viral copies this assay can detect is 138 copies/mL. A negative result does not preclude SARS-Cov-2 infection and should not be used as the sole basis for treatment or other patient management decisions. A negative result may occur with  improper specimen collection/handling, submission of specimen other than nasopharyngeal swab, presence of viral mutation(s) within the areas targeted by this assay, and inadequate number of viral copies(<138 copies/mL). A negative result must be combined with clinical observations, patient history, and epidemiological information. The  expected result is Negative.  Fact Sheet for Patients:  EntrepreneurPulse.com.au  Fact Sheet for Healthcare Providers:  IncredibleEmployment.be  This test is no t yet approved or cleared by the [place] FDA and  has been authorized for detection and/or diagnosis of SARS-CoV-2 by FDA under an Emergency Use Authorization (EUA). This EUA will remain  in effect (meaning this test can be used) for the duration of the COVID-19 declaration under Section 564(b)(1) of the Act, 21 U.S.C.section 360bbb-3(b)(1), unless the authorization is terminated  or revoked sooner.       Influenza A by PCR NEGATIVE NEGATIVE Final   Influenza B by PCR NEGATIVE NEGATIVE Final    Comment: (NOTE) The Xpert Xpress SARS-CoV-2/FLU/RSV plus assay is intended as an aid in the diagnosis of influenza from Nasopharyngeal swab specimens and should not be used as a sole basis for treatment. Nasal washings and aspirates are unacceptable for Xpert Xpress SARS-CoV-2/FLU/RSV testing.  Fact Sheet for Patients: EntrepreneurPulse.com.au  Fact Sheet for Healthcare Providers: IncredibleEmployment.be  This test is not yet approved or cleared by the [place] FDA and has been authorized for detection and/or diagnosis of SARS-CoV-2 by FDA under an Emergency Use Authorization (EUA). This EUA will remain in effect (meaning this test can be used) for the duration of the COVID-19 declaration under Section 564(b)(1) of the Act, 21 U.S.C. section 360bbb-3(b)(1), unless the authorization is terminated or revoked.  Performed at [facility], [address] [place], [place] [address]          Radiology Studies: DG Chest [facility] 1 View  Result Date: 08/06/2021 CLINICAL DATA:  Fever EXAM: PORTABLE CHEST 1 VIEW COMPARISON:  None. FINDINGS: Linear bibasilar opacities could reflect scarring or atelectasis. Heart is normal size. No  effusions. No acute bony abnormality. IMPRESSION: Bibasilar scarring or atelectasis.  No active disease. Electronically Signed   By: Rolm Baptise M.D.   On: 08/06/2021 15:26        Scheduled Meds:  Chlorhexidine Gluconate Cloth  6 each Topical Daily   enoxaparin (LOVENOX) injection  40 mg Subcutaneous Q24H   famotidine  20 mg Oral QPM   lisinopril  20 mg Oral QPM   rosuvastatin  5 mg Oral QPM  Continuous Infusions:  cefTRIAXone (ROCEPHIN)  IV Stopped (08/07/21 1318)     LOS: 2 days    Time spent: 35 mins.More than 50% of that time was spent in counseling and/or coordination of care.      Shelly Coss, MD [facility] P11/15/2022, 8:16 AM

## 2021-08-08 NOTE — Progress Notes (Signed)
Patient ID: Aaron Roberts, male   DOB: 03/03/1971, 50 y.o.   MRN: [id number]    Subjective: Doing well overnight.  No fever/chills.  Objective: Vital signs in last 24 hours: Temp:  [97.3 F (36.3 C)-98.8 F (37.1 C)] 97.3 F (36.3 C) (11/15 0504) Pulse Rate:  [85-97] 85 (11/15 0504) Resp:  [16-18] 16 (11/15 0504) BP: (120-157)/(84-107) 120/95 (11/15 0504) SpO2:  [97 %-100 %] 100 % (11/15 0504)  Intake/Output from previous day: 11/14 0701 - 11/15 0700 In: 575.7 [P.O.:480; IV Piggyback:95.7] Out: 3325 [Urine:3325] Intake/Output this shift: No intake/output data recorded.  Physical Exam:  General: Alert and oriented Abdomen: Soft, ND Incisions: C/D/I GU: Urine clear Ext: NT, No erythema  Lab Results: Recent Labs    08/06/21 1202 08/07/21 0521 08/08/21 0515  HGB 14.9 12.5* 12.8*  HCT 45.4 38.3* 37.9*   CBC Latest Ref Rng & Units 08/08/2021 08/07/2021 08/06/2021  WBC 4.0 - 10.5 K/uL 4.7 4.9 4.2  Hemoglobin 13.0 - 17.0 g/dL 12.8(L) 12.5(L) 14.9  Hematocrit 39.0 - 52.0 % 37.9(L) 38.3(L) 45.4  Platelets 150 - 400 K/uL 257 234 271     BMET Recent Labs    08/06/21 1202 08/08/21 0515  NA 135 141  K 3.9 3.8  CL 97* 109  CO2 27 24  GLUCOSE 146* 120*  BUN 17 10  CREATININE 1.29* 0.94  CALCIUM 9.6 8.7*     Studies/Results: DG Chest Port 1 View  Result Date: 08/06/2021 CLINICAL DATA:  Fever EXAM: PORTABLE CHEST 1 VIEW COMPARISON:  None. FINDINGS: Linear bibasilar opacities could reflect scarring or atelectasis. Heart is normal size. No effusions. No acute bony abnormality. IMPRESSION: Bibasilar scarring or atelectasis.  No active disease. Electronically Signed   By: Rolm Baptise M.D.   On: 08/06/2021 15:26    Assessment/Plan: POD # 8 s/p robotic radical prostatectomy and BPLND admitted with E coli bacteremia: Clinically improved.  Urine culture positive for E coli consistent with blood cultures.  Awaiting final sensitivities.  Will need full course of appropriate  antibiotic therapy for bacteremia.  He passed a planned voiding trial this morning and will be incontinent at first.  He will begin his pelvic floor exercises as previously instructed and f/u with pelvic physical therapy as already scheduled.     LOS: 2 days   Dutch Gray 08/08/2021, 7:46 AM

## 2021-08-09 DIAGNOSIS — N390 Urinary tract infection, site not specified: Secondary | ICD-10-CM | POA: Diagnosis not present

## 2021-08-09 LAB — CULTURE, BLOOD (ROUTINE X 2)

## 2021-08-09 MED ORDER — CEFADROXIL 500 MG PO CAPS
1000.0000 mg | ORAL_CAPSULE | Freq: Two times a day (BID) | ORAL | Status: DC
  Administered 2021-08-09: 1000 mg via ORAL
  Filled 2021-08-09 (×2): qty 2

## 2021-08-09 MED ORDER — CEFADROXIL 500 MG PO CAPS: 1000.0000 mg | ORAL_CAPSULE | Freq: Two times a day (BID) | ORAL | 0 refills | Status: AC

## 2021-08-09 NOTE — Discharge Summary (Signed)
Physician Discharge Summary  Aaron Roberts QJJ:[id number] DOB: Feb 23, 1971 DOA: 08/06/2021  PCP: Beola Cord, FNP  Admit date: 08/06/2021 Discharge date: 08/09/2021  Admitted From: Home Disposition:  Home  Discharge Condition:Stable CODE STATUS:FULL Diet recommendation: Heart Healthy  Brief/Interim Summary:  Patient is a 50 year old male with history of prostatic adenocarcinoma status post prostatectomy on 07/31/2021 and was discharged home with Foley catheter, hypertension, hyperlipidemia, GERD who presented to the emergency part with complaints of fever and chills.  He was tachycardic on presentation.  Lactic acid is elevated at 2.5.  UA was positive with nitrate, large amount of leukocytes, red blood cells, WBC more than 50, many bacteria.  Patient was admitted for the management of possible sepsis secondary to UTI.  Blood culture, urine culture showed pansensitive E. coli.  He is medically stable for discharge to home today.  IV antibiotics changed to oral.  Following problems were addressed during her hospitalization:   Sepsis secondary to complicated UTI/bacteremia: Recent history of prostatectomy for prostatic adenocarcinoma.  Was recently discharged home with Foley catheter.  Presented with fever, chills, tachycardia, lactic acidosis.Treated with IV fluids,  ceftriaxone.    Sepsis physiology has improved. Blood cultures/Urine culture  now showing pansensitive E. coli.  Antibiotics changed to cefadroxil.  Prostatic adenocarcinoma: Status post prostatectomy on 07/31/2021.  Urology consulted and were following.  Foley removed   Hypertension: Continue home medications on discharge  Hyperlipidemia: On Crestor   GERD: On Pepcid   Diarrhea: Denies any abdominal pain, nausea or vomiting.  He says he develops diarrhea sometimes when he gets IV fluids.  Low suspicion for infectious etiology.  Diarrhea has resolved.    Discharge Diagnoses:  Principal Problem:   Complicated UTI  (urinary tract infection) Active Problems:   Prostate cancer ([place])   Sepsis ([place])   Essential hypertension   Hyperlipidemia    Discharge Instructions  Discharge Instructions     Diet - low sodium heart healthy   Complete by: As directed    Discharge instructions   Complete by: As directed    1)Please follow-up with your urologist as an outpatient 2)Take prescribed medications as instructed   Increase activity slowly   Complete by: As directed    No wound care   Complete by: As directed       Allergies as of 08/09/2021   No Known Allergies      Medication List     STOP taking these medications    sulfamethoxazole-trimethoprim 800-160 MG tablet Commonly known as: BACTRIM DS   traMADol 50 MG tablet Commonly known as: Ultram       TAKE these medications    acetaminophen 500 MG tablet Commonly known as: TYLENOL Take 1,000 mg by mouth every 6 (six) hours as needed (pain).   cefadroxil 500 MG capsule Commonly known as: DURICEF Take 2 capsules (1,000 mg total) by mouth 2 (two) times daily for 4 days.   docusate sodium 100 MG capsule Commonly known as: COLACE Take 1 capsule (100 mg total) by mouth 2 (two) times daily. What changed:  when to take this reasons to take this   famotidine 20 MG tablet Commonly known as: PEPCID Take 20 mg by mouth every evening.   lisinopril 20 MG tablet Commonly known as: ZESTRIL Take 20 mg by mouth every evening.   rosuvastatin 5 MG tablet Commonly known as: CRESTOR Take 5 mg by mouth every evening.        Follow-up Information     Beola Cord, [place]. Schedule an  appointment as soon as possible for a visit in 1 week(s).   Specialty: Family Medicine Contact information: [address] [place] [address] [phone]                No Known Allergies  Consultations: Urology   Procedures/Studies: [facility] Chest Port 1 View  Result Date: 08/06/2021 CLINICAL DATA:  Fever EXAM: PORTABLE CHEST  1 VIEW COMPARISON:  None. FINDINGS: Linear bibasilar opacities could reflect scarring or atelectasis. Heart is normal size. No effusions. No acute bony abnormality. IMPRESSION: Bibasilar scarring or atelectasis.  No active disease. Electronically Signed   By: Rolm Baptise M.D.   On: 08/06/2021 15:26      Subjective:  Patient seen and examined the bedside this morning.  Hemodynamically stable for discharge today.   Discharge Exam: Vitals:   08/08/21 2332 08/09/21 0448  BP: (!) 136/93 (!) 137/91  Pulse: 86 91  Resp: 14 16  Temp: 98.1 F (36.7 C) 98.3 F (36.8 C)  SpO2: 100% 98%   Vitals:   08/08/21 0504 08/08/21 1432 08/08/21 2332 08/09/21 0448  BP: (!) 120/95 126/80 (!) 136/93 (!) 137/91  Pulse: 85 80 86 91  Resp: 16 18 14 16   Temp: (!) 97.3 F (36.3 C) 98.4 F (36.9 C) 98.1 F (36.7 C) 98.3 F (36.8 C)  TempSrc: Oral Oral Oral Oral  SpO2: 100% 100% 100% 98%  Weight:      Height:        General: Pt is alert, awake, not in acute distress Cardiovascular: RRR, S1/S2 +, no rubs, no gallops Respiratory: CTA bilaterally, no wheezing, no rhonchi Abdominal: Soft, NT, ND, bowel sounds + Extremities: no edema, no cyanosis    The results of significant diagnostics from this hospitalization (including imaging, microbiology, ancillary and laboratory) are listed below for reference.     Microbiology: Recent Results (from the past 240 hour(s))  Culture, blood (Routine x 2)     Status: Abnormal   Collection Time: 08/06/21 12:00 PM   Specimen: BLOOD  Result Value Ref Range Status   Specimen Description   Final    BLOOD LEFT ANTECUBITAL Performed at [facility], [address] [place], [place] [address]    Special Requests   Final    BOTTLES DRAWN AEROBIC AND ANAEROBIC Blood Culture results may not be optimal due to an excessive volume of blood received in culture bottles Performed at [facility], [address] [place], [place]  [address]    Culture  Setup Time   Final    GRAM NEGATIVE RODS ANAEROBIC BOTTLE ONLY CRITICAL RESULT CALLED TO, READ BACK BY AND VERIFIED WITH: M LILLISTON,PHARMD@[id number]  08/07/21 [place] Performed at [facility], [place] [address], [place],  [address]    Culture ESCHERICHIA COLI (A)  Final   Report Status 08/09/2021 FINAL  Final   Organism ID, Bacteria ESCHERICHIA COLI  Final      Susceptibility   Escherichia coli - MIC*    AMPICILLIN <=2 SENSITIVE Sensitive     CEFAZOLIN <=4 SENSITIVE Sensitive     CEFEPIME <=0.12 SENSITIVE Sensitive     CEFTAZIDIME <=1 SENSITIVE Sensitive     CEFTRIAXONE <=0.25 SENSITIVE Sensitive     CIPROFLOXACIN <=0.25 SENSITIVE Sensitive     GENTAMICIN <=1 SENSITIVE Sensitive     IMIPENEM <=0.25 SENSITIVE Sensitive     TRIMETH/SULFA <=20 SENSITIVE Sensitive     AMPICILLIN/SULBACTAM <=2 SENSITIVE Sensitive     PIP/TAZO <=4 SENSITIVE Sensitive     *  ESCHERICHIA COLI  Blood Culture ID Panel (Reflexed)     Status: Abnormal   Collection Time: 08/06/21 12:00 PM  Result Value Ref Range Status   Enterococcus faecalis NOT DETECTED NOT DETECTED Final   Enterococcus Faecium NOT DETECTED NOT DETECTED Final   Listeria monocytogenes NOT DETECTED NOT DETECTED Final   Staphylococcus species NOT DETECTED NOT DETECTED Final   Staphylococcus aureus (BCID) NOT DETECTED NOT DETECTED Final   Staphylococcus epidermidis NOT DETECTED NOT DETECTED Final   Staphylococcus lugdunensis NOT DETECTED NOT DETECTED Final   Streptococcus species NOT DETECTED NOT DETECTED Final   Streptococcus agalactiae NOT DETECTED NOT DETECTED Final   Streptococcus pneumoniae NOT DETECTED NOT DETECTED Final   Streptococcus pyogenes NOT DETECTED NOT DETECTED Final   A.calcoaceticus-baumannii NOT DETECTED NOT DETECTED Final   Bacteroides fragilis NOT DETECTED NOT DETECTED Final   Enterobacterales DETECTED (A) NOT DETECTED Final    Comment: Enterobacterales represent a large order of gram negative  bacteria, not a single organism. CRITICAL RESULT CALLED TO, READ BACK BY AND VERIFIED WITH: M LILLISTON,PHARMD@[id number]  08/07/21 [place]    Enterobacter cloacae complex NOT DETECTED NOT DETECTED Final   Escherichia coli DETECTED (A) NOT DETECTED Final    Comment: CRITICAL RESULT CALLED TO, READ BACK BY AND VERIFIED WITH: M LILLISTON,PHARMD@[id number]  08/07/21 [place]    Klebsiella aerogenes NOT DETECTED NOT DETECTED Final   Klebsiella oxytoca NOT DETECTED NOT DETECTED Final   Klebsiella pneumoniae NOT DETECTED NOT DETECTED Final   Proteus species NOT DETECTED NOT DETECTED Final   Salmonella species NOT DETECTED NOT DETECTED Final   Serratia marcescens NOT DETECTED NOT DETECTED Final   Haemophilus influenzae NOT DETECTED NOT DETECTED Final   Neisseria meningitidis NOT DETECTED NOT DETECTED Final   Pseudomonas aeruginosa NOT DETECTED NOT DETECTED Final   Stenotrophomonas maltophilia NOT DETECTED NOT DETECTED Final   Candida albicans NOT DETECTED NOT DETECTED Final   Candida auris NOT DETECTED NOT DETECTED Final   Candida glabrata NOT DETECTED NOT DETECTED Final   Candida krusei NOT DETECTED NOT DETECTED Final   Candida parapsilosis NOT DETECTED NOT DETECTED Final   Candida tropicalis NOT DETECTED NOT DETECTED Final   Cryptococcus neoformans/gattii NOT DETECTED NOT DETECTED Final   CTX-M ESBL NOT DETECTED NOT DETECTED Final   Carbapenem resistance IMP NOT DETECTED NOT DETECTED Final   Carbapenem resistance KPC NOT DETECTED NOT DETECTED Final   Carbapenem resistance NDM NOT DETECTED NOT DETECTED Final   Carbapenem resist OXA 48 LIKE NOT DETECTED NOT DETECTED Final   Carbapenem resistance VIM NOT DETECTED NOT DETECTED Final    Comment: Performed at [facility], [address], [place], [place] [address]  Culture, blood (Routine x 2)     Status: None (Preliminary result)   Collection Time: 08/06/21 12:20 PM   Specimen: BLOOD  Result Value Ref Range Status   Specimen Description   Final    BLOOD  RIGHT ANTECUBITAL Performed at [facility], [address] [place], [place] [address]    Special Requests   Final    BOTTLES DRAWN AEROBIC AND ANAEROBIC Blood Culture adequate volume Performed at [facility], [address] [place], [place] [address]    Culture   Final    NO GROWTH 3 DAYS Performed at [facility], [place] [address] [place], [place] [address]    Report Status PENDING  Incomplete  Urine Culture     Status: Abnormal   Collection Time: 08/06/21 12:34 PM   Specimen: Urine, Catheterized  Result Value Ref Range Status   Specimen Description   Final    URINE, CATHETERIZED Performed at [facility], [address] [place], [place] [address]    Special Requests   Final    NONE Performed at [facility], [address] [place], [place] [address]    Culture >=100,000 COLONIES/mL ESCHERICHIA COLI (A)  Final   Report Status 08/08/2021 FINAL  Final   Organism ID, Bacteria ESCHERICHIA COLI (A)  Final      Susceptibility   Escherichia coli - MIC*    AMPICILLIN <=2 SENSITIVE Sensitive     CEFAZOLIN <=4 SENSITIVE Sensitive     CEFEPIME <=0.12 SENSITIVE Sensitive     CEFTRIAXONE <=0.25 SENSITIVE Sensitive     CIPROFLOXACIN <=0.25 SENSITIVE Sensitive     GENTAMICIN <=1 SENSITIVE Sensitive     IMIPENEM <=0.25 SENSITIVE Sensitive     NITROFURANTOIN <=16 SENSITIVE Sensitive     TRIMETH/SULFA <=20 SENSITIVE Sensitive     AMPICILLIN/SULBACTAM <=2 SENSITIVE Sensitive     PIP/TAZO <=4 SENSITIVE Sensitive     * >=100,000 COLONIES/mL ESCHERICHIA COLI  Resp Panel by RT-PCR (Flu A&B, Covid) Nasopharyngeal Swab     Status: None   Collection Time: 08/06/21  1:34 PM   Specimen: Nasopharyngeal Swab; Nasopharyngeal(NP) swabs in vial transport medium  Result Value Ref Range Status   SARS Coronavirus 2 by RT PCR NEGATIVE NEGATIVE Final    Comment: (NOTE) SARS-CoV-2 target nucleic acids are NOT  DETECTED.  The SARS-CoV-2 RNA is generally detectable in upper respiratory specimens during the acute phase of infection. The lowest concentration of SARS-CoV-2 viral copies this assay can detect is 138 copies/mL. A negative result does not preclude SARS-Cov-2 infection and should not be used as the sole basis for treatment or other patient management decisions. A negative result may occur with  improper specimen collection/handling, submission of specimen other than nasopharyngeal swab, presence of viral mutation(s) within the areas targeted by this assay, and inadequate number of viral copies(<138 copies/mL). A negative result must be combined with clinical observations, patient history, and epidemiological information. The expected result is Negative.  Fact Sheet for Patients:  EntrepreneurPulse.com.au  Fact Sheet for Healthcare Providers:  IncredibleEmployment.be  This test is no t yet approved or cleared by the [place] FDA and  has been authorized for detection and/or diagnosis of SARS-CoV-2 by FDA under an Emergency Use Authorization (EUA). This EUA will remain  in effect (meaning this test can be used) for the duration of the COVID-19 declaration under Section 564(b)(1) of the Act, 21 U.S.C.section 360bbb-3(b)(1), unless the authorization is terminated  or revoked sooner.       Influenza A by PCR NEGATIVE NEGATIVE Final   Influenza B by PCR NEGATIVE NEGATIVE Final    Comment: (NOTE) The Xpert Xpress SARS-CoV-2/FLU/RSV plus assay is intended as an aid in the diagnosis of influenza from Nasopharyngeal swab specimens and should not be used as a sole basis for treatment. Nasal washings and aspirates are unacceptable for Xpert Xpress SARS-CoV-2/FLU/RSV testing.  Fact Sheet for Patients: EntrepreneurPulse.com.au  Fact Sheet for Healthcare Providers: IncredibleEmployment.be  This test is not yet  approved or cleared by the [place] FDA and has been authorized for detection and/or diagnosis of SARS-CoV-2 by FDA under an Emergency Use Authorization (EUA). This EUA will remain in effect (meaning this test can be used) for the duration of the COVID-19 declaration under Section 564(b)(1) of the Act, 21 U.S.C. section 360bbb-3(b)(1), unless the authorization is terminated  or revoked.  Performed at [facility], [address] [place], [place] [address]      Labs: BNP (last 3 results) No results for input(s): BNP in the last 8760 hours. Basic Metabolic Panel: Recent Labs  Lab 08/06/21 1202 08/08/21 0515  NA 135 141  K 3.9 3.8  CL 97* 109  CO2 27 24  GLUCOSE 146* 120*  BUN 17 10  CREATININE 1.29* 0.94  CALCIUM 9.6 8.7*   Liver Function Tests: Recent Labs  Lab 08/06/21 1202  AST 46*  ALT 43  ALKPHOS 92  BILITOT 0.9  PROT 7.9  ALBUMIN 4.2   No results for input(s): LIPASE, AMYLASE in the last 168 hours. No results for input(s): AMMONIA in the last 168 hours. CBC: Recent Labs  Lab 08/06/21 1202 08/07/21 0521 08/08/21 0515  WBC 4.2 4.9 4.7  NEUTROABS 3.9  --  2.3  HGB 14.9 12.5* 12.8*  HCT 45.4 38.3* 37.9*  MCV 90.6 91.0 90.0  PLT 271 234 257   Cardiac Enzymes: No results for input(s): CKTOTAL, CKMB, CKMBINDEX, TROPONINI in the last 168 hours. BNP: Invalid input(s): POCBNP CBG: No results for input(s): GLUCAP in the last 168 hours. D-Dimer No results for input(s): DDIMER in the last 72 hours. Hgb A1c No results for input(s): HGBA1C in the last 72 hours. Lipid Profile No results for input(s): CHOL, HDL, LDLCALC, TRIG, CHOLHDL, LDLDIRECT in the last 72 hours. Thyroid function studies No results for input(s): TSH, T4TOTAL, T3FREE, THYROIDAB in the last 72 hours.  Invalid input(s): FREET3 Anemia work up No results for input(s): VITAMINB12, FOLATE, FERRITIN, TIBC, IRON, RETICCTPCT in the last 72 hours. Urinalysis     Component Value Date/Time   COLORURINE YELLOW 08/06/2021 1234   APPEARANCEUR HAZY (A) 08/06/2021 1234   LABSPEC 1.012 08/06/2021 1234   PHURINE 5.5 08/06/2021 1234   GLUCOSEU NEGATIVE 08/06/2021 1234   HGBUR LARGE (A) 08/06/2021 1234   BILIRUBINUR NEGATIVE 08/06/2021 1234   KETONESUR NEGATIVE 08/06/2021 1234   PROTEINUR TRACE (A) 08/06/2021 1234   NITRITE POSITIVE (A) 08/06/2021 1234   LEUKOCYTESUR LARGE (A) 08/06/2021 1234   Sepsis Labs Invalid input(s): PROCALCITONIN,  WBC,  LACTICIDVEN Microbiology Recent Results (from the past 240 hour(s))  Culture, blood (Routine x 2)     Status: Abnormal   Collection Time: 08/06/21 12:00 PM   Specimen: BLOOD  Result Value Ref Range Status   Specimen Description   Final    BLOOD LEFT ANTECUBITAL Performed at [facility], [address] [place], [place] [address]    Special Requests   Final    BOTTLES DRAWN AEROBIC AND ANAEROBIC Blood Culture results may not be optimal due to an excessive volume of blood received in culture bottles Performed at [facility], [address] [place], [place] [address]    Culture  Setup Time   Final    GRAM NEGATIVE RODS ANAEROBIC BOTTLE ONLY CRITICAL RESULT CALLED TO, READ BACK BY AND VERIFIED WITH: M LILLISTON,PHARMD@[id number]  08/07/21 [place] Performed at [facility], [place] [address] [place], [place] [address]    Culture ESCHERICHIA COLI (A)  Final   Report Status 08/09/2021 FINAL  Final   Organism ID, Bacteria ESCHERICHIA COLI  Final      Susceptibility   Escherichia coli - MIC*    AMPICILLIN <=2 SENSITIVE Sensitive     CEFAZOLIN <=4 SENSITIVE Sensitive     CEFEPIME <=0.12 SENSITIVE Sensitive     CEFTAZIDIME <=1 SENSITIVE Sensitive  CEFTRIAXONE <=0.25 SENSITIVE Sensitive     CIPROFLOXACIN <=0.25 SENSITIVE Sensitive     GENTAMICIN <=1 SENSITIVE Sensitive     IMIPENEM <=0.25 SENSITIVE Sensitive     TRIMETH/SULFA <=20 SENSITIVE Sensitive      AMPICILLIN/SULBACTAM <=2 SENSITIVE Sensitive     PIP/TAZO <=4 SENSITIVE Sensitive     * ESCHERICHIA COLI  Blood Culture ID Panel (Reflexed)     Status: Abnormal   Collection Time: 08/06/21 12:00 PM  Result Value Ref Range Status   Enterococcus faecalis NOT DETECTED NOT DETECTED Final   Enterococcus Faecium NOT DETECTED NOT DETECTED Final   Listeria monocytogenes NOT DETECTED NOT DETECTED Final   Staphylococcus species NOT DETECTED NOT DETECTED Final   Staphylococcus aureus (BCID) NOT DETECTED NOT DETECTED Final   Staphylococcus epidermidis NOT DETECTED NOT DETECTED Final   Staphylococcus lugdunensis NOT DETECTED NOT DETECTED Final   Streptococcus species NOT DETECTED NOT DETECTED Final   Streptococcus agalactiae NOT DETECTED NOT DETECTED Final   Streptococcus pneumoniae NOT DETECTED NOT DETECTED Final   Streptococcus pyogenes NOT DETECTED NOT DETECTED Final   A.calcoaceticus-baumannii NOT DETECTED NOT DETECTED Final   Bacteroides fragilis NOT DETECTED NOT DETECTED Final   Enterobacterales DETECTED (A) NOT DETECTED Final    Comment: Enterobacterales represent a large order of gram negative bacteria, not a single organism. CRITICAL RESULT CALLED TO, READ BACK BY AND VERIFIED WITH: M LILLISTON,PHARMD@[id number]  08/07/21 [place]    Enterobacter cloacae complex NOT DETECTED NOT DETECTED Final   Escherichia coli DETECTED (A) NOT DETECTED Final    Comment: CRITICAL RESULT CALLED TO, READ BACK BY AND VERIFIED WITH: M LILLISTON,PHARMD@[email or web address]  08/07/21 [place]    Klebsiella aerogenes NOT DETECTED NOT DETECTED Final   Klebsiella oxytoca NOT DETECTED NOT DETECTED Final   Klebsiella pneumoniae NOT DETECTED NOT DETECTED Final   Proteus species NOT DETECTED NOT DETECTED Final   Salmonella species NOT DETECTED NOT DETECTED Final   Serratia marcescens NOT DETECTED NOT DETECTED Final   Haemophilus influenzae NOT DETECTED NOT DETECTED Final   Neisseria meningitidis NOT DETECTED NOT DETECTED Final   Pseudomonas  aeruginosa NOT DETECTED NOT DETECTED Final   Stenotrophomonas maltophilia NOT DETECTED NOT DETECTED Final   Candida albicans NOT DETECTED NOT DETECTED Final   Candida auris NOT DETECTED NOT DETECTED Final   Candida glabrata NOT DETECTED NOT DETECTED Final   Candida krusei NOT DETECTED NOT DETECTED Final   Candida parapsilosis NOT DETECTED NOT DETECTED Final   Candida tropicalis NOT DETECTED NOT DETECTED Final   Cryptococcus neoformans/gattii NOT DETECTED NOT DETECTED Final   CTX-M ESBL NOT DETECTED NOT DETECTED Final   Carbapenem resistance IMP NOT DETECTED NOT DETECTED Final   Carbapenem resistance KPC NOT DETECTED NOT DETECTED Final   Carbapenem resistance NDM NOT DETECTED NOT DETECTED Final   Carbapenem resist OXA 48 LIKE NOT DETECTED NOT DETECTED Final   Carbapenem resistance VIM NOT DETECTED NOT DETECTED Final    Comment: Performed at [facility], [address] [place], [place] [address]  Culture, blood (Routine x 2)     Status: None (Preliminary result)   Collection Time: 08/06/21 12:20 PM   Specimen: BLOOD  Result Value Ref Range Status   Specimen Description   Final    BLOOD RIGHT ANTECUBITAL Performed at [facility], [address], [place], [place] [address]    Special Requests   Final    BOTTLES DRAWN AEROBIC AND ANAEROBIC Blood Culture adequate volume Performed at [facility], [address] [place], [place] [address]  Culture   Final    NO GROWTH 3 DAYS Performed at [facility], [place] [address], [place], [place] [address]    Report Status PENDING  Incomplete  Urine Culture     Status: Abnormal   Collection Time: 08/06/21 12:34 PM   Specimen: Urine, Catheterized  Result Value Ref Range Status   Specimen Description   Final    URINE, CATHETERIZED Performed at [facility], [address] [place], [place] [address]    Special Requests   Final    NONE Performed at [facility], [address] [place], [place] [address]    Culture >=100,000 COLONIES/mL ESCHERICHIA COLI (A)  Final   Report Status 08/08/2021 FINAL  Final   Organism ID, Bacteria ESCHERICHIA COLI (A)  Final      Susceptibility   Escherichia coli - MIC*    AMPICILLIN <=2 SENSITIVE Sensitive     CEFAZOLIN <=4 SENSITIVE Sensitive     CEFEPIME <=0.12 SENSITIVE Sensitive     CEFTRIAXONE <=0.25 SENSITIVE Sensitive     CIPROFLOXACIN <=0.25 SENSITIVE Sensitive     GENTAMICIN <=1 SENSITIVE Sensitive     IMIPENEM <=0.25 SENSITIVE Sensitive     NITROFURANTOIN <=16 SENSITIVE Sensitive     TRIMETH/SULFA <=20 SENSITIVE Sensitive     AMPICILLIN/SULBACTAM <=2 SENSITIVE Sensitive     PIP/TAZO <=4 SENSITIVE Sensitive     * >=100,000 COLONIES/mL ESCHERICHIA COLI  Resp Panel by RT-PCR (Flu A&B, Covid) Nasopharyngeal Swab     Status: None   Collection Time: 08/06/21  1:34 PM   Specimen: Nasopharyngeal Swab; Nasopharyngeal(NP) swabs in vial transport medium  Result Value Ref Range Status   SARS Coronavirus 2 by RT PCR NEGATIVE NEGATIVE Final    Comment: (NOTE) SARS-CoV-2 target nucleic acids are NOT DETECTED.  The SARS-CoV-2 RNA is generally detectable in upper respiratory specimens during the acute phase of infection. The lowest concentration of SARS-CoV-2 viral copies this assay can detect is 138 copies/mL. A negative result does not preclude SARS-Cov-2 infection and should not be used as the sole basis for treatment or other patient management decisions. A negative result may occur with  improper specimen collection/handling, submission of specimen other than nasopharyngeal swab, presence of viral mutation(s) within the areas targeted by this assay, and inadequate number of viral copies(<138 copies/mL). A negative result must be combined with clinical observations, patient history, and epidemiological information. The expected result is Negative.  Fact Sheet for Patients:   EntrepreneurPulse.com.au  Fact Sheet for Healthcare Providers:  IncredibleEmployment.be  This test is no t yet approved or cleared by the [place] FDA and  has been authorized for detection and/or diagnosis of SARS-CoV-2 by FDA under an Emergency Use Authorization (EUA). This EUA will remain  in effect (meaning this test can be used) for the duration of the COVID-19 declaration under Section 564(b)(1) of the Act, 21 U.S.C.section 360bbb-3(b)(1), unless the authorization is terminated  or revoked sooner.       Influenza A by PCR NEGATIVE NEGATIVE Final   Influenza B by PCR NEGATIVE NEGATIVE Final    Comment: (NOTE) The Xpert Xpress SARS-CoV-2/FLU/RSV plus assay is intended as an aid in the diagnosis of influenza from Nasopharyngeal swab specimens and should not be used as a sole basis for treatment. Nasal washings and aspirates are unacceptable for Xpert Xpress SARS-CoV-2/FLU/RSV testing.  Fact Sheet for Patients: EntrepreneurPulse.com.au  Fact Sheet for Healthcare Providers: IncredibleEmployment.be  This test is not yet approved or cleared by the [place]  States FDA and has been authorized for detection and/or diagnosis of SARS-CoV-2 by FDA under an Emergency Use Authorization (EUA). This EUA will remain in effect (meaning this test can be used) for the duration of the COVID-19 declaration under Section 564(b)(1) of the Act, 21 U.S.C. section 360bbb-3(b)(1), unless the authorization is terminated or revoked.  Performed at [facility], [address], [place], [place] [address]     Please note: You were cared for by a hospitalist during your hospital stay. Once you are discharged, your primary care physician will handle any further medical issues. Please note that NO REFILLS for any discharge medications will be authorized once you are discharged, as it is imperative that you  return to your primary care physician (or establish a relationship with a primary care physician if you do not have one) for your post hospital discharge needs so that they can reassess your need for medications and monitor your lab values.    Time coordinating discharge: 40 minutes  SIGNED:   Shelly Coss, MD  [facility] 08/09/2021, 10:57 AM Pager [phone]  If 7PM-7AM, please contact night-coverage [email or web address] Password [email or web address]

## 2021-08-11 LAB — CULTURE, BLOOD (ROUTINE X 2)
Culture: NO GROWTH
Special Requests: ADEQUATE

## 2022-11-23 IMAGING — DX DG CHEST 1V PORT
1 series · 1 of 1 positions shown · non-contrast
Comparison: None.

CLINICAL DATA: Fever

EXAM:
PORTABLE CHEST 1 VIEW

[chest]
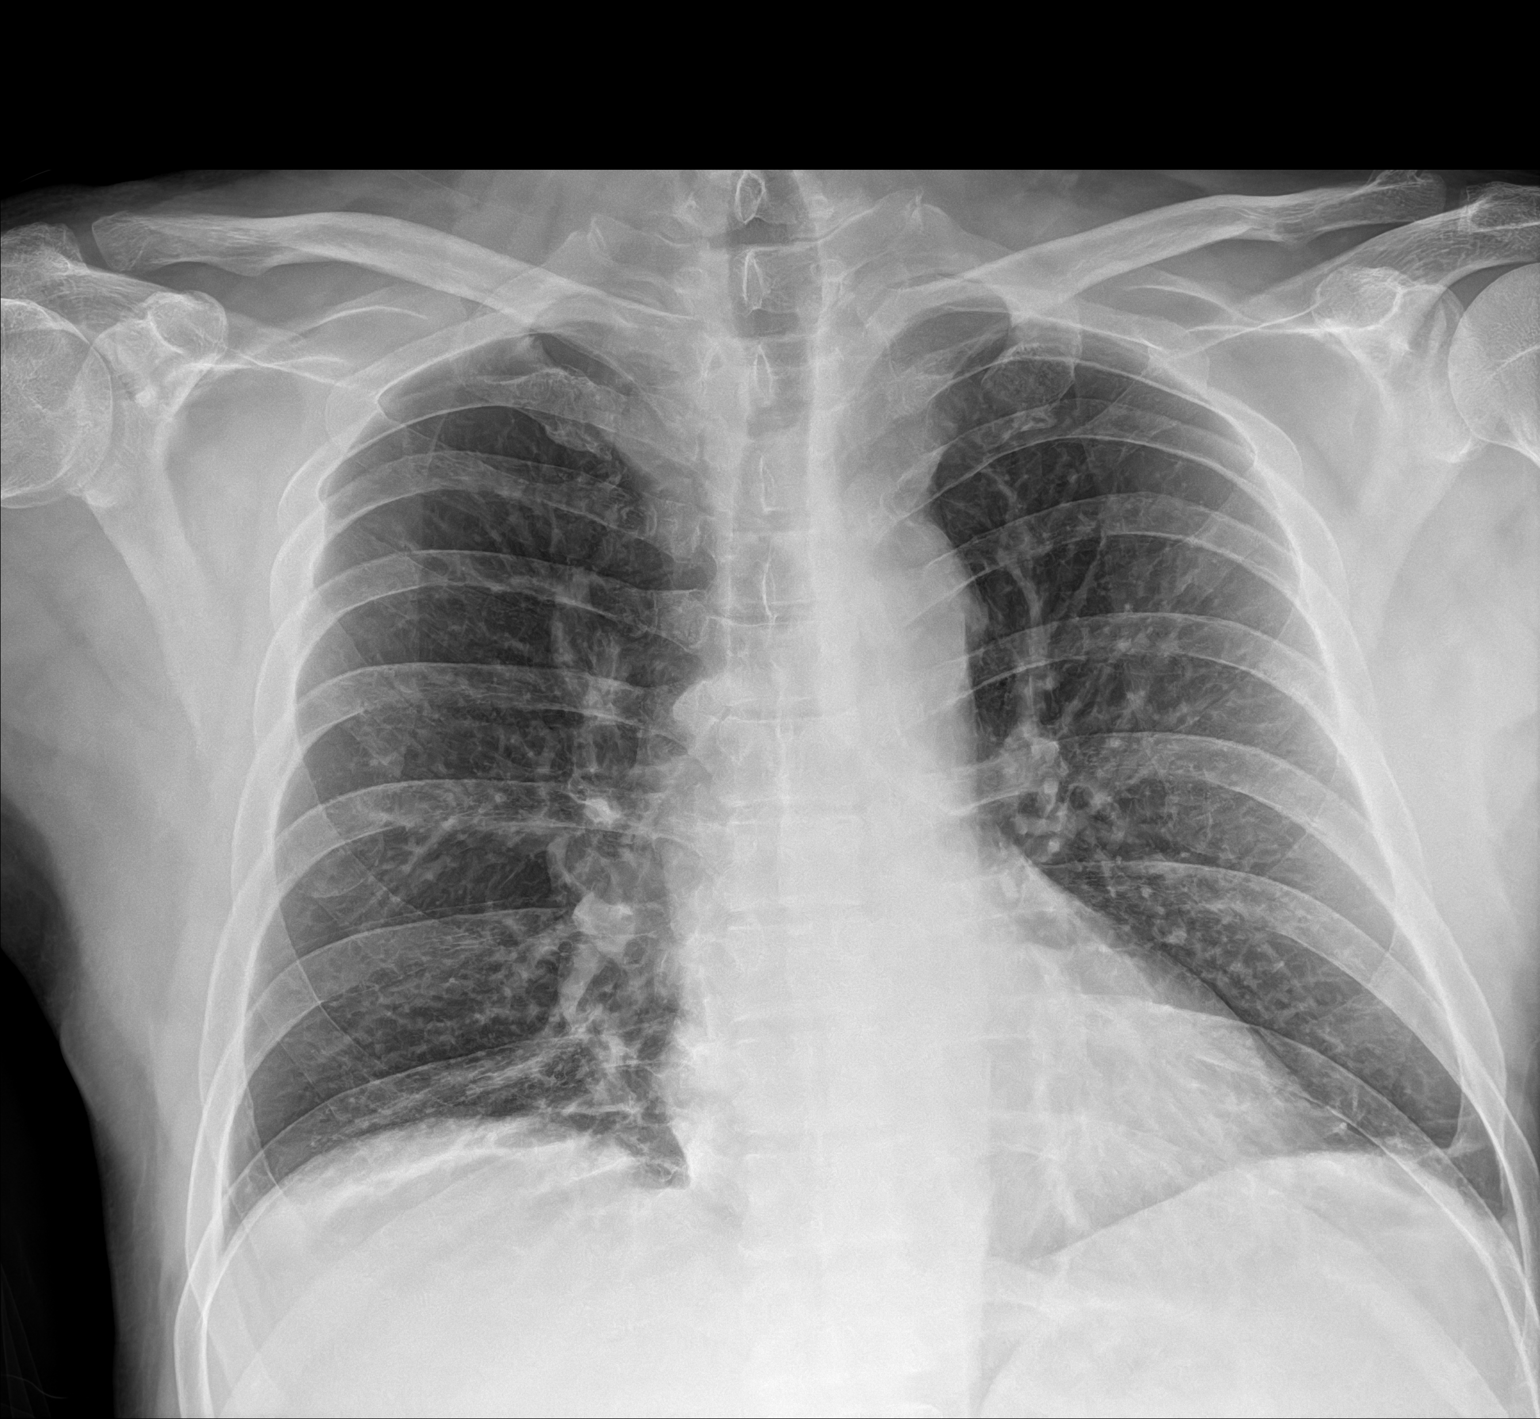

[1 of 1 positions shown; findings below may reference images not displayed]

FINDINGS: Linear bibasilar opacities could reflect scarring or atelectasis.
Heart is normal size. No effusions. No acute bony abnormality.
IMPRESSION: Bibasilar scarring or atelectasis.  No active disease.
